# Patient Record
Sex: Male | Born: 1960 | Race: White | Hispanic: No | Marital: Married | Smoking: Current every day smoker
Health system: Southern US, Community
[De-identification: ages and names within clinical notes are randomized; demographics above are authoritative.]

## PROBLEM LIST (undated history)

## (undated) DIAGNOSIS — J45909 Unspecified asthma, uncomplicated: Secondary | ICD-10-CM

## (undated) DIAGNOSIS — J939 Pneumothorax, unspecified: Secondary | ICD-10-CM

## (undated) DIAGNOSIS — J449 Chronic obstructive pulmonary disease, unspecified: Secondary | ICD-10-CM

## (undated) DIAGNOSIS — M25562 Pain in left knee: Secondary | ICD-10-CM

## (undated) DIAGNOSIS — K7460 Unspecified cirrhosis of liver: Secondary | ICD-10-CM

## (undated) DIAGNOSIS — M25551 Pain in right hip: Secondary | ICD-10-CM

## (undated) HISTORY — PX: NO PAST SURGERIES: SHX2092

---

## 2003-11-29 ENCOUNTER — Other Ambulatory Visit: Payer: Self-pay

## 2004-05-25 ENCOUNTER — Other Ambulatory Visit: Payer: Self-pay

## 2004-06-15 ENCOUNTER — Other Ambulatory Visit: Payer: Self-pay

## 2004-08-26 ENCOUNTER — Other Ambulatory Visit: Payer: Self-pay

## 2004-09-13 ENCOUNTER — Other Ambulatory Visit: Payer: Self-pay

## 2004-12-18 ENCOUNTER — Emergency Department: Payer: Self-pay | Admitting: Emergency Medicine

## 2005-09-24 ENCOUNTER — Emergency Department: Payer: Self-pay | Admitting: Unknown Physician Specialty

## 2005-09-24 ENCOUNTER — Other Ambulatory Visit: Payer: Self-pay

## 2005-12-11 ENCOUNTER — Emergency Department: Payer: Self-pay | Admitting: Emergency Medicine

## 2006-01-18 ENCOUNTER — Other Ambulatory Visit: Payer: Self-pay

## 2006-01-18 ENCOUNTER — Inpatient Hospital Stay: Payer: Self-pay

## 2006-04-12 ENCOUNTER — Emergency Department: Payer: Self-pay | Admitting: Emergency Medicine

## 2006-04-26 ENCOUNTER — Ambulatory Visit: Payer: Self-pay | Admitting: Unknown Physician Specialty

## 2007-02-01 ENCOUNTER — Emergency Department: Payer: Self-pay | Admitting: Emergency Medicine

## 2007-04-11 ENCOUNTER — Emergency Department: Payer: Self-pay | Admitting: Emergency Medicine

## 2007-04-19 ENCOUNTER — Emergency Department: Payer: Self-pay | Admitting: Emergency Medicine

## 2007-04-19 ENCOUNTER — Other Ambulatory Visit: Payer: Self-pay

## 2007-04-29 ENCOUNTER — Emergency Department: Payer: Self-pay | Admitting: Emergency Medicine

## 2007-08-21 ENCOUNTER — Emergency Department: Payer: Self-pay | Admitting: Emergency Medicine

## 2007-08-25 ENCOUNTER — Inpatient Hospital Stay: Payer: Self-pay | Admitting: Internal Medicine

## 2007-08-25 ENCOUNTER — Other Ambulatory Visit: Payer: Self-pay

## 2007-09-01 ENCOUNTER — Emergency Department: Payer: Self-pay | Admitting: Emergency Medicine

## 2007-09-29 ENCOUNTER — Emergency Department: Payer: Self-pay | Admitting: Emergency Medicine

## 2007-10-16 ENCOUNTER — Ambulatory Visit: Payer: Self-pay | Admitting: Family Medicine

## 2007-10-28 ENCOUNTER — Other Ambulatory Visit: Payer: Self-pay

## 2007-10-28 ENCOUNTER — Inpatient Hospital Stay: Payer: Self-pay | Admitting: Internal Medicine

## 2007-12-19 ENCOUNTER — Inpatient Hospital Stay: Payer: Self-pay | Admitting: Internal Medicine

## 2007-12-19 ENCOUNTER — Other Ambulatory Visit: Payer: Self-pay

## 2008-01-09 ENCOUNTER — Ambulatory Visit: Payer: Self-pay | Admitting: Family Medicine

## 2008-01-25 ENCOUNTER — Other Ambulatory Visit: Payer: Self-pay

## 2008-01-26 ENCOUNTER — Inpatient Hospital Stay: Payer: Self-pay | Admitting: Internal Medicine

## 2008-01-27 ENCOUNTER — Ambulatory Visit: Payer: Self-pay | Admitting: Oncology

## 2008-02-07 ENCOUNTER — Ambulatory Visit: Payer: Self-pay | Admitting: Oncology

## 2008-02-15 ENCOUNTER — Emergency Department: Payer: Self-pay | Admitting: Emergency Medicine

## 2008-02-15 ENCOUNTER — Other Ambulatory Visit: Payer: Self-pay

## 2008-02-24 ENCOUNTER — Ambulatory Visit: Payer: Self-pay | Admitting: Oncology

## 2008-03-03 ENCOUNTER — Ambulatory Visit: Payer: Self-pay | Admitting: Internal Medicine

## 2008-03-07 ENCOUNTER — Ambulatory Visit: Payer: Self-pay | Admitting: Internal Medicine

## 2008-03-26 ENCOUNTER — Ambulatory Visit: Payer: Self-pay | Admitting: Oncology

## 2008-04-20 ENCOUNTER — Emergency Department: Payer: Self-pay | Admitting: Emergency Medicine

## 2008-10-20 ENCOUNTER — Emergency Department: Payer: Self-pay | Admitting: Emergency Medicine

## 2008-10-27 ENCOUNTER — Inpatient Hospital Stay: Payer: Self-pay | Admitting: Internal Medicine

## 2011-04-28 ENCOUNTER — Emergency Department: Payer: Self-pay | Admitting: Emergency Medicine

## 2011-06-26 ENCOUNTER — Ambulatory Visit: Payer: Self-pay | Admitting: Internal Medicine

## 2011-07-07 ENCOUNTER — Inpatient Hospital Stay: Payer: Self-pay | Admitting: Internal Medicine

## 2011-07-08 LAB — CEA: CEA: 13.2 ng/mL — ABNORMAL HIGH (ref 0.0–4.7)

## 2011-07-15 LAB — CANCER ANTIGEN 19-9: CA 19-9: <1 U/mL (ref 0–35)

## 2011-07-27 ENCOUNTER — Ambulatory Visit: Payer: Self-pay | Admitting: Internal Medicine

## 2011-09-21 ENCOUNTER — Ambulatory Visit: Payer: Self-pay | Admitting: Internal Medicine

## 2011-09-22 ENCOUNTER — Ambulatory Visit: Payer: Self-pay | Admitting: Internal Medicine

## 2011-09-26 ENCOUNTER — Ambulatory Visit: Payer: Self-pay | Admitting: Internal Medicine

## 2011-10-27 ENCOUNTER — Ambulatory Visit: Payer: Self-pay | Admitting: Internal Medicine

## 2011-12-14 ENCOUNTER — Ambulatory Visit: Payer: Self-pay | Admitting: Gastroenterology

## 2012-02-24 ENCOUNTER — Ambulatory Visit: Payer: Self-pay | Admitting: Gastroenterology

## 2012-04-08 ENCOUNTER — Emergency Department: Payer: Self-pay | Admitting: Emergency Medicine

## 2012-11-14 ENCOUNTER — Ambulatory Visit: Payer: Self-pay | Admitting: Gastroenterology

## 2013-02-02 LAB — CBC
HCT: 42.4 % (ref 40.0–52.0)
HGB: 14 g/dL (ref 13.0–18.0)
MCH: 36.1 pg — ABNORMAL HIGH (ref 26.0–34.0)
MCHC: 33.1 g/dL (ref 32.0–36.0)
MCV: 109 fL — ABNORMAL HIGH (ref 80–100)
Platelet: 155 10*3/uL (ref 150–440)
RBC: 3.88 10*6/uL — ABNORMAL LOW (ref 4.40–5.90)

## 2013-02-02 LAB — COMPREHENSIVE METABOLIC PANEL
Albumin: 3.9 g/dL (ref 3.4–5.0)
Alkaline Phosphatase: 117 U/L (ref 50–136)
BUN: 8 mg/dL (ref 7–18)
Co2: 26 mmol/L (ref 21–32)
Creatinine: 1.19 mg/dL (ref 0.60–1.30)
EGFR (African American): >60
EGFR (Non-African Amer.): >60
Glucose: 92 mg/dL (ref 65–99)
Osmolality: 272 (ref 275–301)
Sodium: 137 mmol/L (ref 136–145)

## 2013-02-02 LAB — LIPASE, BLOOD: Lipase: 82 U/L (ref 73–393)

## 2013-02-02 LAB — RAPID INFLUENZA A&B ANTIGENS

## 2013-02-03 ENCOUNTER — Inpatient Hospital Stay: Payer: Self-pay | Admitting: Internal Medicine

## 2013-02-03 LAB — URINALYSIS, COMPLETE
Bilirubin,UR: NEGATIVE
Blood: NEGATIVE
Glucose,UR: NEGATIVE mg/dL (ref 0–75)
Hyaline Cast: 21
Leukocyte Esterase: NEGATIVE
Nitrite: NEGATIVE
Ph: 5 (ref 4.5–8.0)
RBC,UR: <1 /HPF (ref 0–5)
Specific Gravity: 1.01 (ref 1.003–1.030)
Squamous Epithelial: <1

## 2013-02-03 LAB — HEPATIC FUNCTION PANEL A (ARMC)
Alkaline Phosphatase: 84 U/L (ref 50–136)
Bilirubin,Total: 0.8 mg/dL (ref 0.2–1.0)
SGOT(AST): 22 U/L (ref 15–37)
SGPT (ALT): 14 U/L (ref 12–78)

## 2013-02-03 LAB — LIPASE, BLOOD: Lipase: 53 U/L — ABNORMAL LOW (ref 73–393)

## 2013-02-05 LAB — CBC WITH DIFFERENTIAL/PLATELET
Eosinophil %: 0.8 %
HCT: 33.2 % — ABNORMAL LOW (ref 40.0–52.0)
HGB: 11.3 g/dL — ABNORMAL LOW (ref 13.0–18.0)
Lymphocyte %: 17.7 %
MCH: 37.2 pg — ABNORMAL HIGH (ref 26.0–34.0)
MCHC: 34.2 g/dL (ref 32.0–36.0)
Monocyte #: 1 x10 3/mm (ref 0.2–1.0)
Neutrophil #: 7.6 10*3/uL — ABNORMAL HIGH (ref 1.4–6.5)
Neutrophil %: 71.9 %
Platelet: 108 10*3/uL — ABNORMAL LOW (ref 150–440)
WBC: 10.6 10*3/uL (ref 3.8–10.6)

## 2013-02-05 LAB — BASIC METABOLIC PANEL
Anion Gap: 8 (ref 7–16)
BUN: 6 mg/dL — ABNORMAL LOW (ref 7–18)
Creatinine: 0.78 mg/dL (ref 0.60–1.30)
EGFR (African American): >60
Glucose: 122 mg/dL — ABNORMAL HIGH (ref 65–99)
Osmolality: 276 (ref 275–301)
Potassium: 4 mmol/L (ref 3.5–5.1)
Sodium: 139 mmol/L (ref 136–145)

## 2013-02-06 LAB — PLATELET COUNT: Platelet: 132 10*3/uL — ABNORMAL LOW (ref 150–440)

## 2013-02-08 LAB — CBC WITH DIFFERENTIAL/PLATELET
Basophil %: 0.4 %
HCT: 32.8 % — ABNORMAL LOW (ref 40.0–52.0)
Lymphocyte #: 0.9 10*3/uL — ABNORMAL LOW (ref 1.0–3.6)
Lymphocyte %: 10.8 %
MCH: 36.7 pg — ABNORMAL HIGH (ref 26.0–34.0)
MCHC: 34.2 g/dL (ref 32.0–36.0)
MCV: 107 fL — ABNORMAL HIGH (ref 80–100)
Monocyte #: 0.8 x10 3/mm (ref 0.2–1.0)
Monocyte %: 9.8 %
Neutrophil #: 6.4 10*3/uL (ref 1.4–6.5)
Neutrophil %: 78.9 %
Platelet: 165 10*3/uL (ref 150–440)
WBC: 8.1 10*3/uL (ref 3.8–10.6)

## 2013-02-08 LAB — EXPECTORATED SPUTUM ASSESSMENT W GRAM STAIN, RFLX TO RESP C

## 2013-02-08 LAB — BASIC METABOLIC PANEL
Anion Gap: 6 — ABNORMAL LOW (ref 7–16)
BUN: 19 mg/dL — ABNORMAL HIGH (ref 7–18)
EGFR (African American): >60
EGFR (Non-African Amer.): >60
Glucose: 133 mg/dL — ABNORMAL HIGH (ref 65–99)
Potassium: 3.9 mmol/L (ref 3.5–5.1)

## 2013-02-08 LAB — CULTURE, BLOOD (SINGLE)

## 2013-02-09 LAB — CULTURE, BLOOD (SINGLE)

## 2013-02-11 LAB — BASIC METABOLIC PANEL
Calcium, Total: 8.2 mg/dL — ABNORMAL LOW (ref 8.5–10.1)
Chloride: 99 mmol/L (ref 98–107)
Co2: 32 mmol/L (ref 21–32)
EGFR (Non-African Amer.): >60
Glucose: 137 mg/dL — ABNORMAL HIGH (ref 65–99)
Potassium: 4.4 mmol/L (ref 3.5–5.1)
Sodium: 138 mmol/L (ref 136–145)

## 2013-02-11 LAB — VANCOMYCIN, TROUGH: Vancomycin, Trough: 15 ug/mL (ref 10–20)

## 2013-02-11 LAB — CBC WITH DIFFERENTIAL/PLATELET
Eosinophil #: 0 10*3/uL (ref 0.0–0.7)
Eosinophil %: 0 %
HGB: 12.3 g/dL — ABNORMAL LOW (ref 13.0–18.0)
Lymphocyte #: 0.6 10*3/uL — ABNORMAL LOW (ref 1.0–3.6)
MCH: 36.4 pg — ABNORMAL HIGH (ref 26.0–34.0)
MCHC: 33.9 g/dL (ref 32.0–36.0)
MCV: 108 fL — ABNORMAL HIGH (ref 80–100)
Monocyte #: 0.4 x10 3/mm (ref 0.2–1.0)
Monocyte %: 4.7 %
RBC: 3.37 10*6/uL — ABNORMAL LOW (ref 4.40–5.90)

## 2013-02-13 LAB — BASIC METABOLIC PANEL
Anion Gap: 7 (ref 7–16)
Calcium, Total: 8.4 mg/dL — ABNORMAL LOW (ref 8.5–10.1)
Chloride: 98 mmol/L (ref 98–107)
Co2: 32 mmol/L (ref 21–32)
EGFR (African American): >60
Potassium: 4.3 mmol/L (ref 3.5–5.1)

## 2013-04-08 ENCOUNTER — Ambulatory Visit: Payer: Self-pay

## 2013-04-08 LAB — COMPREHENSIVE METABOLIC PANEL
Albumin: 3.4 g/dL (ref 3.4–5.0)
Alkaline Phosphatase: 105 U/L (ref 50–136)
Anion Gap: 2 — ABNORMAL LOW (ref 7–16)
BUN: 3 mg/dL — ABNORMAL LOW (ref 7–18)
Chloride: 108 mmol/L — ABNORMAL HIGH (ref 98–107)
Co2: 31 mmol/L (ref 21–32)
Creatinine: 0.65 mg/dL (ref 0.60–1.30)
EGFR (African American): >60
Glucose: 97 mg/dL (ref 65–99)
Potassium: 3.5 mmol/L (ref 3.5–5.1)
SGOT(AST): 31 U/L (ref 15–37)
SGPT (ALT): 19 U/L (ref 12–78)
Sodium: 141 mmol/L (ref 136–145)
Total Protein: 7 g/dL (ref 6.4–8.2)

## 2013-04-26 ENCOUNTER — Ambulatory Visit: Payer: Self-pay | Admitting: Family Medicine

## 2013-05-06 ENCOUNTER — Ambulatory Visit: Payer: Self-pay | Admitting: Gastroenterology

## 2014-02-22 LAB — BASIC METABOLIC PANEL
Anion Gap: 4 — ABNORMAL LOW (ref 7–16)
BUN: 8 mg/dL (ref 7–18)
CHLORIDE: 103 mmol/L (ref 98–107)
Calcium, Total: 8.3 mg/dL — ABNORMAL LOW (ref 8.5–10.1)
Co2: 26 mmol/L (ref 21–32)
Creatinine: 0.86 mg/dL (ref 0.60–1.30)
Glucose: 87 mg/dL (ref 65–99)
Osmolality: 264 (ref 275–301)
POTASSIUM: 4.2 mmol/L (ref 3.5–5.1)
Sodium: 133 mmol/L — ABNORMAL LOW (ref 136–145)

## 2014-02-22 LAB — CBC
HCT: 42.4 % (ref 40.0–52.0)
HGB: 14.4 g/dL (ref 13.0–18.0)
MCH: 35.4 pg — AB (ref 26.0–34.0)
MCHC: 33.9 g/dL (ref 32.0–36.0)
MCV: 105 fL — ABNORMAL HIGH (ref 80–100)
Platelet: 139 10*3/uL — ABNORMAL LOW (ref 150–440)
RBC: 4.06 10*6/uL — ABNORMAL LOW (ref 4.40–5.90)
RDW: 13.6 % (ref 11.5–14.5)
WBC: 9.7 10*3/uL (ref 3.8–10.6)

## 2014-02-22 LAB — TROPONIN I: Troponin-I: <0.02 ng/mL

## 2014-02-23 ENCOUNTER — Inpatient Hospital Stay: Payer: Self-pay | Admitting: Internal Medicine

## 2014-02-23 LAB — RAPID INFLUENZA A&B ANTIGENS

## 2014-02-26 LAB — PLATELET COUNT: PLATELETS: 123 10*3/uL — AB (ref 150–440)

## 2014-02-28 LAB — CULTURE, BLOOD (SINGLE)

## 2015-02-12 ENCOUNTER — Emergency Department: Payer: Self-pay | Admitting: Emergency Medicine

## 2015-04-17 NOTE — Discharge Summary (Signed)
PATIENT NAME:  Albert Fowler, Albert Fowler MR#:  [id number] DATE OF BIRTH:  1961/07/15  DATE OF ADMISSION:  02/03/2013 DATE OF DISCHARGE:  02/14/2013  ADMITTING DIAGNOSES: Fevers, chills, cough, generalized weakness.   DISCHARGE DIAGNOSES:  1. Systemic inflammatory response reaction secondary to methicillin-resistant Staphylococcus aureus pneumonia.  2. Ileus versus pseudo-obstruction secondary to constipation, now improved.  3. Septic shock due to pneumonia.  4. Acute on chronic obstructive pulmonary disease exacerbation leading to acute respiratory failure.  5. Urinary retention likely related to immobility and hospitalization, now resolved.  6. Abdominal pain felt to be cough, urinary retention and possibly constipation, now symptoms resolved.  7. Ongoing tobacco abuse.  8. Generalized weakness.  9. History of liver cirrhosis.  10. Alcohol abuse.  11. History of pneumothorax in the past.   PERTINENT LABORATORY AND EVALUATIONS: PA and lateral chest x-ray on February 8 revealed no acute cardiopulmonary process. Repeat 3-view of the abdomen PA and lateral revealed nonobstructive gas pattern, new heterogenous opacity in the right lung concerning for an infection. Chest x-ray on February 13 revealed bilateral upper lobe pneumonia. CT scan of the abdomen and pelvis without contrast showed mildly dilated loops of small bowel, but no obstruction. Admitting BMP: Glucose was 92, BUN 8, creatinine 1.19, sodium 137, potassium 4.1, chloride was 103, CO2 was 26, calcium was 9.1, lipase 82. LFTs were normal. WBC count was 14.8, hemoglobin 14.0, platelet count was 109. Influenza A and B were negative. Blood cultures showed coag-negative Staph. Sputum culture showed heavy methicillin-resistant Staph aureus. Most recent BMP on February 17 showed a BUN of 21, creatinine 0.80, and WBC count was 9.4, hemoglobin 12.3, platelet count was 177. Chest x-ray on the 17th showed mild interstitial edema.   CONSULTANTS: Christopher A.  Lundquist, MD  HOSPITAL COURSE: Please refer to interim summary done by Dr. Katharina Caperima Vaickute done on date of February 14. The patient is a 54 year old white male with history of emphysema, ongoing tobacco abuse, who presented on February 8 with complaint of fevers, chills, cough as well as generalized weakness. The patient, on arrival, was thought to have possible COPD exacerbation as well as possible pneumonia. He was treated with IV Levaquin. The patient had repeat chest x-ray which showed pneumonia again. His sputum cultures did show MRSA. He was started on vancomycin since the 15th. The patient was slow to recover. He was also treated for COPD exacerbation, and he received some IV Lasix for fluid overload. The patient's respiratory status is significantly improved, and now he is on room air. For his MRSA pneumonia, the patient received vancomycin from the 15th, so he is going to be finishing a course of Zyvox for the next 10 days. Zyvox assistance program was called and made sure that he would have a co-pay of $6. The patient also was noted to have COPD exacerbation, which was treated with nebulizers and steroids. He also developed abdominal distention and pain during hospitalization for which he was seen by surgery. He was thought to have an ileus versus pseudo-obstruction. He was treated with laxatives, with significant improvement in his symptoms. The patient also had some urinary retention, likely as a result of the hospitalization. He has been able to urinate without any difficulties. At this point, he is afebrile, his respiratory status is significantly improved, and he is feeling much better and is stable for discharge to home.   DISCHARGE MEDICATIONS:  1. Advair 250/50 one puff b.i.d.  2. Nadolol 20 mg 1 tab p.o. daily.  3. Protonix 40  daily.  4. Zoloft 50 daily.  5. Spiriva 18 mcg daily.  6. Hydroxyzine 25 mg 4 times per day as needed for itching.  7. Tylenol 650 q.6 p.r.n. for headache.  8.  Albuterol/Atrovent nebulizers 1 puff 4 times per day as needed.  9. Senna 1 tab p.o. daily.  10. Zyvox 600 mg 1 tab p.o. q.12 for 10 days.   DIET: Low sodium, low fat, low cholesterol.   ACTIVITY: As tolerated.   DISCHARGE FOLLOWUP: Follow with the Jeralyn Ruths in 1 to 2 weeks.   TIME SPENT: 35 minutes spent on the discharge.    ____________________________ Lacie Scotts. Allena Katz, MD shp:OSi D: 02/14/2013 17:50:16 ET T: 02/15/2013 05:50:17 ET JOB#: [id number]  cc: Francys Bolin Fowler. Allena Katz, MD, <Dictator> Charise Carwin MD ELECTRONICALLY SIGNED 02/16/2013 12:14

## 2015-04-17 NOTE — H&P (Signed)
PATIENT NAME:  Albert Fowler, HASEGAWA MR#:  [id number] DATE OF BIRTH:  1961-11-10  DATE OF ADMISSION:  02/03/2013  PRIMARY CARE PHYSICIAN:  [facility].   REFERRING PHYSICIAN:  Dr. Si Raider.   CHIEF COMPLAINT:  Fever, chills, cough and generalized weakness.   HISTORY OF PRESENT ILLNESS:  The patient is a 54 year old Caucasian male with history of chronic obstructive pulmonary disease, ongoing tobacco abuse, liver cirrhosis.  He was in his usual state of health until about yesterday when he had generalized weakness associated with cough for the last two days, however today he started to have fever and chills.  He had generalized aching muscles pains and worsening of his generalized weakness.  The patient was evaluated in the Emergency Room and he was found to have fever reaching 100.1 axillary and evidence of hypotension with a blood pressure systolic between 70 to 80.  The patient was given intravenous fluids.  Blood cultures were taken and prompt IV antibiotic was started.  His chest x-ray had failed to show pneumonia, although he has fine crackles at the right lower base.   REVIEW OF SYSTEMS:  CONSTITUTIONAL:  Reports fever and chills and generalized fatigue.  EYES:  No blurring of vision.  No double vision.  EARS, NOSE, THROAT:  No hearing impairment.  No sore throat.  No dysphagia.  CARDIOVASCULAR:  No chest pain.  No shortness of breath.  No palpitations.  No syncope. RESPIRATORY:  He has cough, a few wheezing.  No hemoptysis.  No chest pain.  GASTROINTESTINAL:  No abdominal pain, no vomiting, no diarrhea.  GENITOURINARY:  No dysuria.  No frequency of urination.  MUSCULOSKELETAL:  He has generalized aching pains, but no joint swelling.  No muscular pain or swelling other than his aching pains.  INTEGUMENTARY:  No skin rash.  No ulcers.  NEUROLOGY:  No focal weakness.  No seizure activity.  No headache.  PSYCHIATRY:  No anxiety.  No depression.  ENDOCRINE:  No polyuria or  polydipsia.  No heat or cold intolerance.  HEMATOLOGY:  No easy bruisability.  No lymph node enlargement.   PAST MEDICAL HISTORY:  Liver cirrhosis, ex-chronic alcoholic.  He quit two years ago.  History of ongoing tobacco abuse, emphysema, history of left-sided pneumothorax.   PAST SURGICAL HISTORY:  Chest tube to left lung due to development of pneumothorax.   SOCIAL HABITS:  Chronic smoker 2 packs per day since the age of 59 and he continues to smoke.  He is ex-chronic alcoholic.  He quit two years ago.  No other drug abuse.   FAMILY HISTORY:  His older brother died from complication of liver cirrhosis.  His father died from lymph node cancer, likely he is referring to lymphoma.  His mother died from complications of emphysema and she was a smoker.   SOCIAL HISTORY:  He is married, living with his wife.  He lives on disability based on his lung problem.   ADMISSION MEDICATIONS:  Advair Diskus 250/50 twice a day, Spiriva 1 inhalation once a day, calcium with vitamin D twice a day, magnesium oxide 400 mg 3 times a day, ibuprofen as needed, Protonix 40 mg a day, Zoloft 25 mg once a day.   ALLERGIES:  CEPHALOZIN CAUSING SKIN RASH.   PHYSICAL EXAMINATION: VITAL SIGNS:  Blood pressure 75 to 80 systolic, at one point went up to 100 after intravenous bolus of fluid, however it is back down again.  Heart rate is 102 per minute.  Respiratory rate is 20,  temperature 100.1 axillary.  Latest blood pressure was 74/48.  GENERAL APPEARANCE:  Middle-aged male who looks much older than his stated age sitting on the stretcher in no acute distress.  HEAD AND NECK:  No pallor.  No icterus.  No cyanosis.  ENT:  Ear examination revealed normal hearing, no discharge, no lesions.  Nasal mucosa was normal, no ulcers, no bleeding or discharge.  Oropharyngeal area showed patient has no teeth except one tooth.  No oral thrush.  No ulcers.  EYE:  Revealed normal eyelids and conjunctivae.  The pupils were 6 mm, round,  equal and reactive to light.  NECK:  Supple.  Trachea at midline.  No thyromegaly.  No cervical lymphadenopathy.  No masses.  HEART:  Distant heart sounds, barely audible S1 and S2.  No S3 or S4.  No murmur.  No gallop.  No carotid bruits.  RESPIRATORY:  Normal breathing pattern at the upper normal of 20 per minute without use of accessory muscles.  He has fine crackles at the right base.  Few scattered wheezing.  Good air entry.  ABDOMEN:  Soft without tenderness.  No hepatosplenomegaly.  No masses.  No hernias.  SKIN:  No ulcers.  No subcutaneous nodules.  He has hypopigmented lesions over the shoulders and the back.  MUSCULOSKELETAL:  No joint swelling.  No clubbing.  NEUROLOGIC:  Cranial nerves II-XII were intact.  No focal motor deficit.  PSYCHIATRIC:  The patient is alert, oriented x 3.  Mood and affect were normal.   LABORATORY FINDINGS AND RADIOLOGIC DATA:  His chest x-ray had failed to show pneumonia as read by the official report, however maybe there is subtle prominence of the lung markings at the right base, may indicate early pneumonia.  This is  a little more prominent than his chest x-ray that was done on July of 2012.  EKG showed sinus tachycardia at rate of 108 per minute, otherwise unremarkable EKG.  CBC showed a white count of 14,800, hemoglobin 14, hematocrit 42, platelet count 155.  Influenza A and B were negative.  Normal liver function tests and liver transaminases.  Serum glucose 92, BUN 8, creatinine 1.1, sodium 137, potassium 4.1, lipase 82.  Calcium 9.1.   ASSESSMENT: 1.  Septic shock.  2.  Acute bronchitis, although I suspect early right lower lobe pneumonia, however the chest x-ray is not supporting that.  3.  Liver cirrhosis.  4.  Chronic obstructive pulmonary disease.  5.  Tobacco abuse.   PLAN:  The patient is now receiving intravenous fluid loading doses to improve his blood pressure.  Blood cultures x 2 were taken.  IV antibiotic using Levaquin was already  started.  Urine was not checked, therefore I just ordered now urine for urinalysis.  Oxygen supplementation and continue home medications as listed above, however I will hold nadolol due to his hypotension.  The patient needs to quit tobacco and I offered nicotine patch.  We will monitor his response to the treatment.  For deep vein thrombosis prophylaxis, I started him on subcutaneous heparin 5000 units twice a day.   TIME SPENT IN EVALUATING THIS PATIENT:  Took more than 55 minutes including reviewing his medical records.      ____________________________ Carney CornersAmir M. Rudene Rearwish, MD amd:ea D: 02/03/2013 00:39:00 ET T: 02/03/2013 04:39:10 ET JOB#: [id number]  cc: Carney CornersAmir M. Rudene Rearwish, MD, <Dictator> Zollie ScaleAMIR M Genell Thede MD ELECTRONICALLY SIGNED 02/04/2013 22:45

## 2015-04-17 NOTE — Consult Note (Signed)
PATIENT NAME:  Albert GivensGARNER, Majed H MR#:  [id number] DATE OF BIRTH:  1961/04/04  DATE OF CONSULTATION:  02/07/2013  REFERRING PHYSICIAN:   CONSULTING PHYSICIAN:  Cristal Deerhristopher A. Shizuye Rupert, MD  REASON FOR CONSULTATION:  Evaluate pericholecystic fluid on CT scan as well as dilated bowel.   HISTORY OF PRESENT ILLNESS:  The patient presented on February 8th with weakness, fever, generalized malaise. He was found to be hypotensive upon admission and was thought to be in septic shock and likely due to pneumonia in the context of cirrhosis and COPD. While in the hospital, he was started on pressors and also was given IV antibiotics. He was to be discharged yesterday; however, he has become increasingly distended during his admission. He also says that he has had left lower quadrant pain which is not new, but has been going on for the last 2 months. Otherwise, no current fevers, chills, night sweats, shortness of breath, cough, chest pain, nausea, vomiting. He does say that he feels constipated at baseline, but was able to have bowel movements today with enemas and laxatives. No dysuria or hematuria.   PAST MEDICAL HISTORY:   1.  History of cirrhosis due to alcohol.  2.  History of esophageal varices seen on EGD.  3.  History of GI bleed due to duodenal ulcer.  4.  History of COPD.  5.  History of left-sided spontaneous pneumothorax status post thoracoscopy, apical blebectomy and pleurodesis.   SOCIAL HABITS:  Does not smoke 2 packs a day which he continues, has not drank for approximately 2 years. No other drug abuse.   FAMILY HISTORY:  Older brother died from cirrhosis, father died from likely lymphoma. Mother died from complications of emphysema.   SOCIAL HISTORY:  Married, lives with wife, is on disability due to COPD.   ADMISSION MEDICATIONS:   1.  Advair Diskus.  2.  Spiriva.  3.  Calcium with vitamin D.  4.  Magnesium oxide.  5.  Ibuprofen p.r.n.  6.  Protonix.  7.  Zoloft.   ALLERGIES:   ANCEF CAUSES SKIN RASH.   REVIEW OF SYSTEMS:  A 12-point review of systems as above.   PHYSICAL EXAMINATION: VITAL SIGNS: Temperature 97.9, pulse 100, blood pressure 133/81, respirations 20, 91% on 2 liters.  GENERAL: No acute distress, alert and oriented x 3, appears chronically ill.  HEAD: Normocephalic, atraumatic.  EYES: No scleral icterus. No conjunctivitis.  FACE: No obvious facial trauma, normal external nose, normal external ears.  LUNGS: Clear to auscultation, moving air well.  HEART: Regular rate and rhythm. No murmurs, rubs or gallops.  ABDOMEN: Soft, moderately distended, nontender in right upper quadrant and left lower quadrant.  EXTREMITIES: Moves all extremities well. Strength is 5 out of 5 in all 4 extremities.  NEUROLOGIC: Cranial nerves II through XII grossly intact. Sensation intact in all 4 extremities.   DIAGNOSTIC DATA:  White cell count of 10.6 down from 14.8 at admission on February 11th, hemoglobin and hematocrit are normal and platelets are 132. LFTs are normal. Lipase is normal. BMP is normal.   CT shows:  1.  I have personally reviewed his CT scan that shows scattered ground-glass pulmonary opacities in bilateral lungs, may be infectious.  2.  Abdomen. Mildly dilated loops of small bowel with no significantly collapsed small bowel, likely ileus pattern per my interpretation.  3.  Cholelithiasis. A small amount of fluid surrounding the gallbladder as well as free fluid in the pelvis. No gallbladder wall thickening.  4.  Significant  stool in the transverse colon and descending colon without any thickening. Does have possibly mild thickening of the rectum which was not read by radiologist.   ASSESSMENT AND PLAN:  The patient is a pleasant 54 year old male who presented with shortness of breath, fever, malaise and was treated for pneumonia. I believe his bowel distention is likely due to ileus. He has a small amount of pelvic fluid and pericholecystic fluid; however, I  am unsure if this is reactive due to his primary process for which he was admitted versus he may have small ascites due to his cirrhosis as per previous consultation notes. It is deemed to be pretty significant as gallstones; however, he does not have symptoms consistent with biliary disease, no right upper quadrant pain currently or at all with any ingestions. Would recommend laxatives versus enemas to allow the patient to have regular bowel movements. No acute surgical intervention necessary or indications for surgery.    ____________________________ Si Raider Candice Tobey, MD cal:si D: 02/07/2013 19:53:30 ET T: 02/07/2013 20:21:32 ET JOB#: [id number]  cc: Cristal Deer A. Mishawn Didion, MD, <Dictator> Jarvis Newcomer MD ELECTRONICALLY SIGNED 02/13/2013 14:05

## 2015-04-18 NOTE — H&P (Signed)
PATIENT NAME:  Albert Fowler, [name] MR#:  [id number] DATE OF BIRTH:  Apr 02, 1961  DATE OF ADMISSION:  02/23/2014  REFERRING PHYSICIAN:  Dr. Dolores FrameSung.   PRIMARY CARE PHYSICIAN:  Dr. Butler DenmarkWroth.   CHIEF COMPLAINT:  Shortness of breath.   HISTORY OF PRESENT ILLNESS:  A 54 year old Caucasian gentleman with past medical history of COPD, non-O2 dependent, alcoholic cirrhosis, presenting with shortness of breath.  Describes two day duration of shortness of breath, mainly as chest tightness and dyspnea on exertion with associated cough, productive yellow sputum with associated fevers and chills.  Denies recent sick contacts.  Denies chest pain, palpitations, or edema.  Denies orthopnea or PND.  Upon arrival to the Emergency Department, he was noted to be hypoxemic, requiring supplemental O2 to keep SaO2 greater than 92%.   REVIEW OF SYSTEMS:  CONSTITUTIONAL:  Positive for fevers, chills, fatigue.  EYES:  Denies blurred vision, double vision, eye pain. EARS, NOSE, THROAT:  Denies tinnitus, ear pain, hearing loss.  RESPIRATORY:  Positive for cough, wheeze, shortness of breath.  Denies hemoptysis.  CARDIOVASCULAR:  Denies chest pain, palpitations and edema.  GASTROINTESTINAL:  Denies nausea, vomiting, diarrhea, abdominal pain.  GENITOURINARY:  Denies dysuria or hematuria.  ENDOCRINE:  Denies nocturia or thyroid problems.  HEMATOLOGIC AND LYMPHATIC:  Denies easy bruising or bleeding.  SKIN:  Denies rash or lesions.  MUSCULOSKELETAL:  Denies pain in neck, back, shoulder, knees, hips or arthritic symptoms.  NEUROLOGIC:  Denies paralysis, paresthesias.  PSYCHIATRIC:  Denies any anxiety or depressive symptoms.  Otherwise, full review of systems performed by me is negative.   PAST MEDICAL HISTORY:  COPD, alcoholic cirrhosis, history of anxiety, depression, not otherwise specified.  Thrombocytopenia.  Pneumothorax status post a left lung surgery.   SOCIAL HISTORY:  Current every day tobacco use.  Denies any recent  alcohol use, stating last drink greater than four years.  Denies drug use.   FAMILY HISTORY:  Positive for coronary artery disease as well as cancer though of unknown type.   ALLERGIES:  CEFAZOLIN.   HOME MEDICATIONS:  Include Tylenol 500 mg 2 tabs every six hours needed for headache.  Zoloft 25 mg by mouth daily, hydroxyzine 25 mg 4 times daily as needed for itching, nadolol 20 mg by mouth daily, Advair 250/50 mcg inhalation 1 puff twice daily, Spiriva 18 mcg inhalation daily, lactulose 10 grams daily, senna 1 tab by mouth daily, Montelukast 10 mg by mouth daily, Protonix 40 mg by mouth daily, dextromethorphan guaifenesin 10 mg/500 mg per 5 mL, 10 mL every four hours as needed for cough, calcium plus vitamin D 500/200 international unit 2 times daily.   PHYSICAL EXAMINATION: VITAL SIGNS:  Temperature 101.7, heart rate 100, respirations 36, blood pressure 111/65, saturating 95% on supplemental O2 2 liters nasal cannula.  Weight 70.3 kg, BMI 24.3.  GENERAL:  Well-nourished, well-developed gentleman who appears acutely ill and in moderate respiratory distress, having difficulty speaking in full sentences.  HEAD:  Normocephalic, atraumatic.  EYES:  Pupils equal, round, reactive to light.  Extraocular muscles intact.  No scleral icterus.  MOUTH:  Moist mucosal membranes.  Dentition intact.  No abscess noted.  EAR, NOSE, THROAT:  Throat clear without exudates.  No external lesions.  NECK:  Supple.  No thyromegaly.  No nodules.  No JVD.  PULMONARY:  Prolonged expiratory phase with associated wheezing, most prominent in the left upper and right lower lobe as well as bibasilar coarse breath sounds with scattered rhonchi.  No use of accessory muscles, though  tachypneic at this time.  Good respiratory effort.  CHEST:  Nontender to palpation.  CARDIOVASCULAR:  S1, S2, tachycardic.  No murmurs, rubs or gallops.  No edema.  Pedal pulses 2+ bilaterally. GASTROINTESTINAL:  Soft, nontender, nondistended.  No  masses.  Positive bowel sounds.  No hepatosplenomegaly.  MUSCULOSKELETAL:  No swelling, clubbing, edema.  Range of motion full in all extremities.  NEUROLOGIC:  Cranial nerves II through XII intact.  No gross focal neurological deficits.  Sensation intact.  Reflexes intact.  SKIN:  No ulcerations, lesions, rashes, cyanosis.  Skin warm, dry.  Turgor intact.  PSYCHIATRIC:  Mood and affect within normal limits.  The patient is awake, alert and oriented x 3.  Insight and judgment intact.   LABORATORY DATA:  Sodium 133, potassium 4.2, chloride 103, bicarb 26, BUN 8, creatinine 0.86, glucose 87.  WBC 9.7, hemoglobin 14.4, platelets 139.  Chest x-ray performed revealing increased interstitial markings; however, no acute cardiopulmonary disease at this time.   ASSESSMENT AND PLAN:  A 54 year old Caucasian gentleman with past medical history of chronic obstructive pulmonary disease, cirrhosis, presenting with shortness of breath.  1.  Sepsis, meeting septic criteria by tachycardia, tachypnea and fever, likely pulmonary source, place on Levaquin for antibiotic coverage, follow culture data of blood and sputum, IV fluid hydration to keep mean arterial pressure greater than 65.  2.  Acute hypoxemic respiratory failure secondary to chronic obstructive pulmonary disease exacerbation, indicated by inability to speak in full sentences secondary to difficulty breathing as well as hypoxemia on arrival, provide DuoNeb q. 4 hours, supplemental O2 to keep SaO2 greater than 92%, incentive spirometry.  Solu-Medrol 60 mg IV daily.  3.  Thrombocytopenia, which is an old finding, no indication for transfusion at this time.  No active bleeding.  Transfusion threshold will be platelets less than 20,000 if no bleeding or less than 50,000 if active bleeding.  4.  Cirrhosis.  Continue with nadolol and lactulose.  5.  Venous thromboembolic prophylaxis with heparin subQ.  6.  CODE STATUS:  THE PATIENT IS FULL CODE.   TIME SPENT:  45  minutes.    ____________________________ Cletis Athens. Hower, MD dkh:ea D: 02/23/2014 00:36:45 ET T: 02/23/2014 02:23:20 ET JOB#: [id number]  cc: Cletis Athens. Hower, MD, <Dictator> DAVID Synetta Shadow MD ELECTRONICALLY SIGNED 02/23/2014 20:55

## 2015-04-18 NOTE — Discharge Summary (Signed)
PATIENT NAME:  Albert Fowler, Albert H MR#:  [id number] DATE OF BIRTH:  1961/02/19  DATE OF ADMISSION:  02/23/2014 DATE OF DISCHARGE:  02/26/2014  DISCHARGE DIAGNOSES: 1.  Chronic obstructive pulmonary disease exacerbation. 2.  Sepsis due to bronchitis.  3.  Cirrhosis.  4.  Tobacco abuse. 5.  Thrombocytopenia.  DISCHARGE MEDICATIONS:   1.  Advair Diskus 250/50, 1 puff b.i.d.  2.  Nadolol 20 mg p.o. daily.  3.  Protonix 40 mg p.o. daily.  4.  Zoloft 25 mg p.o. daily. 5.  Spiriva 18 mcg inhalation daily.  6.  Hydroxyzine hydrochloride 25 mg p.o. 4 times daily. 7.  Albuterol with ipratropium 100/20 mcg 1 puff 4 times daily.  8.  Calcium with vitamin D.  9.  Lactulose 10 grams (that is 15 mL) once a day.  10.  Singulair 10 mg p.o. daily.  11.  Levaquin 500 mg daily. The patient received Levaquin for 7 days.  12.  Prednisone 20 mg 3 tablets daily for 2 days, 2 tablets daily for 2 days, 1 tablet daily for 2 days, and then stop.   The patient was advised to stop the following medication: Robitussin.   Discharged home without any home health but oxygen.   HOSPITAL COURSE:  This is a 54 year old male patient with history of COPD, alcohol cirrhosis, continued tobacco abuse up to 2 packs a day, comes in because of shortness of breath, cough, and also a little phlegm. Also had a fever. The patient's O2 sats were 92% on room air, requiring oxygen. The patient's fever was 101.7 in the Emergency Room. He was also tachycardic with heart rate of 100 beats per minute. He was admitted for sepsis by meeting sepsis criteria with tachycardia, tachypnea, and fever. The patient was started on Levaquin along with Solu-Medrol, IV fluids, and oxygen. The patient had acute hypoxic respiratory failure evidenced by inability to speak full sentences due to difficulty breathing with hypoxemia on arrival. The patient continued on oxygen and Solu-Medrol. Symptoms nicely improved. The patient's labs were normal on admission. Chest  x-ray showed no evidence of acute cardiopulmonary disease on admission. The patient's blood cultures were negative. He felt much better with the Levaquin and steroids. Discharged home with prednisone and Levaquin to complete the course. He is advised to quit smoking. He has Spiriva and Advair. We checked the O2 sats on exertion, on room air and rest. They were above 89%, dropped to 88 just briefly, then went up quickly to above 88%, so he did not qualify for home  oxygen. At the time of discharge, O2 sats were 91% on room air at, and the patient went home in stable condition. The patient also has a history of cirrhosis and thrombocytopenia. The patient follows up with Dr. Ezekiel Slocumbom Wroth.  TIME SPENT ON DISCHARGE PREPARATION: More than 30 minutes.    ____________________________ Katha HammingSnehalatha Jahmiyah Dullea, MD sk:jcm D: 02/28/2014 12:33:17 ET T: 02/28/2014 14:08:14 ET JOB#: [id number]  cc: Katha HammingSnehalatha Sherrie Marsan, MD, <Dictator> Katha HammingSNEHALATHA An Schnabel MD ELECTRONICALLY SIGNED 03/09/2014 11:45

## 2015-05-09 ENCOUNTER — Inpatient Hospital Stay
Admission: EM | Admit: 2015-05-09 | Discharge: 2015-05-12 | DRG: 357 | Disposition: A | Discharge status: Home / Self Care | Payer: Medicare Other | Attending: Internal Medicine | Admitting: Internal Medicine

## 2015-05-09 DIAGNOSIS — K922 Gastrointestinal hemorrhage, unspecified: Secondary | ICD-10-CM

## 2015-05-09 DIAGNOSIS — K3189 Other diseases of stomach and duodenum: Secondary | ICD-10-CM | POA: Diagnosis present

## 2015-05-09 DIAGNOSIS — I8511 Secondary esophageal varices with bleeding: Principal | ICD-10-CM | POA: Diagnosis present

## 2015-05-09 DIAGNOSIS — K8020 Calculus of gallbladder without cholecystitis without obstruction: Secondary | ICD-10-CM | POA: Diagnosis present

## 2015-05-09 DIAGNOSIS — K7460 Unspecified cirrhosis of liver: Secondary | ICD-10-CM | POA: Diagnosis present

## 2015-05-09 DIAGNOSIS — K529 Noninfective gastroenteritis and colitis, unspecified: Secondary | ICD-10-CM | POA: Diagnosis present

## 2015-05-09 DIAGNOSIS — Z789 Other specified health status: Secondary | ICD-10-CM | POA: Diagnosis present

## 2015-05-09 DIAGNOSIS — J45909 Unspecified asthma, uncomplicated: Secondary | ICD-10-CM | POA: Diagnosis present

## 2015-05-09 DIAGNOSIS — Z7289 Other problems related to lifestyle: Secondary | ICD-10-CM | POA: Diagnosis present

## 2015-05-09 DIAGNOSIS — D696 Thrombocytopenia, unspecified: Secondary | ICD-10-CM | POA: Diagnosis present

## 2015-05-09 DIAGNOSIS — F1020 Alcohol dependence, uncomplicated: Secondary | ICD-10-CM | POA: Diagnosis present

## 2015-05-09 DIAGNOSIS — K921 Melena: Secondary | ICD-10-CM | POA: Diagnosis present

## 2015-05-09 DIAGNOSIS — R109 Unspecified abdominal pain: Secondary | ICD-10-CM | POA: Diagnosis present

## 2015-05-09 DIAGNOSIS — J449 Chronic obstructive pulmonary disease, unspecified: Secondary | ICD-10-CM | POA: Diagnosis present

## 2015-05-09 DIAGNOSIS — K7030 Alcoholic cirrhosis of liver without ascites: Secondary | ICD-10-CM | POA: Diagnosis present

## 2015-05-09 DIAGNOSIS — R251 Tremor, unspecified: Secondary | ICD-10-CM | POA: Diagnosis not present

## 2015-05-09 DIAGNOSIS — E440 Moderate protein-calorie malnutrition: Secondary | ICD-10-CM | POA: Diagnosis present

## 2015-05-09 DIAGNOSIS — K766 Portal hypertension: Secondary | ICD-10-CM | POA: Diagnosis present

## 2015-05-09 DIAGNOSIS — F17210 Nicotine dependence, cigarettes, uncomplicated: Secondary | ICD-10-CM | POA: Diagnosis present

## 2015-05-09 HISTORY — DX: Chronic obstructive pulmonary disease, unspecified: J44.9

## 2015-05-09 HISTORY — DX: Pain in left knee: M25.562

## 2015-05-09 HISTORY — DX: Pain in right hip: M25.551

## 2015-05-09 HISTORY — DX: Pneumothorax, unspecified: J93.9

## 2015-05-09 HISTORY — DX: Unspecified cirrhosis of liver: K74.60

## 2015-05-09 HISTORY — DX: Unspecified asthma, uncomplicated: J45.909

## 2015-05-09 NOTE — ED Notes (Signed)
Pt presents to ER alert and in NAD. Pt reports he is "passing dark red blood" in stool x 2 days. Pt c/o abd pain.

## 2015-05-10 ENCOUNTER — Encounter: Payer: Self-pay | Admitting: Emergency Medicine

## 2015-05-10 ENCOUNTER — Emergency Department: Payer: Medicare Other

## 2015-05-10 DIAGNOSIS — K8020 Calculus of gallbladder without cholecystitis without obstruction: Secondary | ICD-10-CM | POA: Diagnosis present

## 2015-05-10 DIAGNOSIS — R109 Unspecified abdominal pain: Secondary | ICD-10-CM | POA: Diagnosis present

## 2015-05-10 DIAGNOSIS — K921 Melena: Secondary | ICD-10-CM | POA: Diagnosis present

## 2015-05-10 DIAGNOSIS — D696 Thrombocytopenia, unspecified: Secondary | ICD-10-CM | POA: Diagnosis present

## 2015-05-10 DIAGNOSIS — E440 Moderate protein-calorie malnutrition: Secondary | ICD-10-CM | POA: Diagnosis present

## 2015-05-10 DIAGNOSIS — K529 Noninfective gastroenteritis and colitis, unspecified: Secondary | ICD-10-CM | POA: Diagnosis present

## 2015-05-10 DIAGNOSIS — R251 Tremor, unspecified: Secondary | ICD-10-CM | POA: Diagnosis not present

## 2015-05-10 DIAGNOSIS — F17210 Nicotine dependence, cigarettes, uncomplicated: Secondary | ICD-10-CM | POA: Diagnosis present

## 2015-05-10 DIAGNOSIS — K3189 Other diseases of stomach and duodenum: Secondary | ICD-10-CM | POA: Diagnosis present

## 2015-05-10 DIAGNOSIS — J449 Chronic obstructive pulmonary disease, unspecified: Secondary | ICD-10-CM | POA: Diagnosis present

## 2015-05-10 DIAGNOSIS — K922 Gastrointestinal hemorrhage, unspecified: Secondary | ICD-10-CM | POA: Diagnosis present

## 2015-05-10 DIAGNOSIS — F1020 Alcohol dependence, uncomplicated: Secondary | ICD-10-CM | POA: Diagnosis present

## 2015-05-10 DIAGNOSIS — I8511 Secondary esophageal varices with bleeding: Secondary | ICD-10-CM | POA: Diagnosis present

## 2015-05-10 DIAGNOSIS — Z7289 Other problems related to lifestyle: Secondary | ICD-10-CM | POA: Diagnosis present

## 2015-05-10 DIAGNOSIS — K766 Portal hypertension: Secondary | ICD-10-CM | POA: Diagnosis present

## 2015-05-10 DIAGNOSIS — K7460 Unspecified cirrhosis of liver: Secondary | ICD-10-CM | POA: Diagnosis present

## 2015-05-10 DIAGNOSIS — K7030 Alcoholic cirrhosis of liver without ascites: Secondary | ICD-10-CM | POA: Diagnosis present

## 2015-05-10 DIAGNOSIS — Z789 Other specified health status: Secondary | ICD-10-CM | POA: Diagnosis present

## 2015-05-10 DIAGNOSIS — J45909 Unspecified asthma, uncomplicated: Secondary | ICD-10-CM | POA: Diagnosis present

## 2015-05-10 LAB — CBC WITH DIFFERENTIAL/PLATELET
BASOS ABS: 0.1 10*3/uL (ref 0–0.1)
Basophils Relative: 1 %
Eosinophils Absolute: 0 10*3/uL (ref 0–0.7)
HCT: 35.9 % — ABNORMAL LOW (ref 40.0–52.0)
HEMOGLOBIN: 12.2 g/dL — AB (ref 13.0–18.0)
LYMPHS ABS: 0.8 10*3/uL — AB (ref 1.0–3.6)
Lymphocytes Relative: 15 %
MCH: 37.6 pg — AB (ref 26.0–34.0)
MCHC: 34.1 g/dL (ref 32.0–36.0)
MCV: 110.2 fL — AB (ref 80.0–100.0)
MONO ABS: 1.1 10*3/uL — AB (ref 0.2–1.0)
NEUTROS ABS: 3.5 10*3/uL (ref 1.4–6.5)
Platelets: 43 10*3/uL — ABNORMAL LOW (ref 150–440)
RBC: 3.26 MIL/uL — AB (ref 4.40–5.90)
RDW: 13.7 % (ref 11.5–14.5)
WBC: 5.5 10*3/uL (ref 3.8–10.6)

## 2015-05-10 LAB — CBC
HCT: 33.8 % — ABNORMAL LOW (ref 40.0–52.0)
HCT: 33.5 % — ABNORMAL LOW (ref 40.0–52.0)
HEMATOCRIT: 34.7 % — AB (ref 40.0–52.0)
HEMOGLOBIN: 11.8 g/dL — AB (ref 13.0–18.0)
HEMOGLOBIN: 11.5 g/dL — AB (ref 13.0–18.0)
Hemoglobin: 11.3 g/dL — ABNORMAL LOW (ref 13.0–18.0)
MCH: 37.4 pg — AB (ref 26.0–34.0)
MCH: 37.7 pg — AB (ref 26.0–34.0)
MCH: 37.5 pg — AB (ref 26.0–34.0)
MCHC: 34 g/dL (ref 32.0–36.0)
MCHC: 34 g/dL (ref 32.0–36.0)
MCHC: 33.7 g/dL (ref 32.0–36.0)
MCV: 110.2 fL — ABNORMAL HIGH (ref 80.0–100.0)
MCV: 110.9 fL — ABNORMAL HIGH (ref 80.0–100.0)
MCV: 111.3 fL — AB (ref 80.0–100.0)
PLATELETS: 44 10*3/uL — AB (ref 150–440)
Platelets: 45 10*3/uL — ABNORMAL LOW (ref 150–440)
Platelets: 47 10*3/uL — ABNORMAL LOW (ref 150–440)
RBC: 3.15 MIL/uL — ABNORMAL LOW (ref 4.40–5.90)
RBC: 3.04 MIL/uL — ABNORMAL LOW (ref 4.40–5.90)
RBC: 3.01 MIL/uL — AB (ref 4.40–5.90)
RDW: 14 % (ref 11.5–14.5)
RDW: 13.6 % (ref 11.5–14.5)
RDW: 13.4 % (ref 11.5–14.5)
WBC: 5.1 10*3/uL (ref 3.8–10.6)
WBC: 4.5 10*3/uL (ref 3.8–10.6)
WBC: 4.2 10*3/uL (ref 3.8–10.6)

## 2015-05-10 LAB — COMPREHENSIVE METABOLIC PANEL
ALBUMIN: 4.1 g/dL (ref 3.5–5.0)
ALK PHOS: 93 U/L (ref 38–126)
ALT: 30 U/L (ref 17–63)
ALT: 28 U/L (ref 17–63)
AST: 72 U/L — ABNORMAL HIGH (ref 15–41)
AST: 75 U/L — AB (ref 15–41)
Albumin: 3.7 g/dL (ref 3.5–5.0)
Alkaline Phosphatase: 101 U/L (ref 38–126)
Anion gap: 11 (ref 5–15)
Anion gap: 11 (ref 5–15)
BUN: 10 mg/dL (ref 6–20)
BUN: 10 mg/dL (ref 6–20)
CALCIUM: 8.4 mg/dL — AB (ref 8.9–10.3)
CO2: 25 mmol/L (ref 22–32)
CO2: 25 mmol/L (ref 22–32)
Calcium: 8.9 mg/dL (ref 8.9–10.3)
Chloride: 97 mmol/L — ABNORMAL LOW (ref 101–111)
Chloride: 99 mmol/L — ABNORMAL LOW (ref 101–111)
Creatinine, Ser: 0.39 mg/dL — ABNORMAL LOW (ref 0.61–1.24)
Creatinine, Ser: 0.43 mg/dL — ABNORMAL LOW (ref 0.61–1.24)
GFR calc non Af Amer: >60 mL/min (ref >60)
GFR calc non Af Amer: >60 mL/min (ref >60)
GLUCOSE: 84 mg/dL (ref 65–99)
Glucose, Bld: 88 mg/dL (ref 65–99)
Potassium: 3.8 mmol/L (ref 3.5–5.1)
Potassium: 4.1 mmol/L (ref 3.5–5.1)
Sodium: 133 mmol/L — ABNORMAL LOW (ref 135–145)
Sodium: 135 mmol/L (ref 135–145)
Total Bilirubin: 1.4 mg/dL — ABNORMAL HIGH (ref 0.3–1.2)
Total Bilirubin: 1.6 mg/dL — ABNORMAL HIGH (ref 0.3–1.2)
Total Protein: 7.8 g/dL (ref 6.5–8.1)
Total Protein: 7.3 g/dL (ref 6.5–8.1)

## 2015-05-10 LAB — PROTIME-INR
INR: 1.07
Prothrombin Time: 14.1 seconds (ref 11.4–15.0)

## 2015-05-10 LAB — TROPONIN I

## 2015-05-10 LAB — AMMONIA: AMMONIA: 15 umol/L (ref 9–35)

## 2015-05-10 LAB — LIPASE, BLOOD: Lipase: 27 U/L (ref 22–51)

## 2015-05-10 MED ORDER — LORAZEPAM 1 MG PO TABS: 1.0000 mg | ORAL_TABLET | Freq: Four times a day (QID) | ORAL | Status: DC | PRN

## 2015-05-10 MED ORDER — IOHEXOL 300 MG/ML  SOLN
100.0000 mL | Freq: Once | INTRAMUSCULAR | Status: AC | PRN
  Administered 2015-05-10: 100 mL via INTRAVENOUS

## 2015-05-10 MED ORDER — FOLIC ACID 1 MG PO TABS
1.0000 mg | ORAL_TABLET | Freq: Every day | ORAL | Status: DC
  Administered 2015-05-10 – 2015-05-12 (×3): 1 mg via ORAL
  Filled 2015-05-10 (×3): qty 1

## 2015-05-10 MED ORDER — ADULT MULTIVITAMIN W/MINERALS CH
1.0000 | ORAL_TABLET | Freq: Every day | ORAL | Status: DC
  Administered 2015-05-10 – 2015-05-12 (×3): 1 via ORAL
  Filled 2015-05-10 (×3): qty 1

## 2015-05-10 MED ORDER — SODIUM CHLORIDE 0.9 % IV SOLN
Freq: Once | INTRAVENOUS | Status: AC
  Administered 2015-05-10: 13:00:00 via INTRAVENOUS
  Filled 2015-05-10: qty 0.5

## 2015-05-10 MED ORDER — THIAMINE HCL 100 MG/ML IJ SOLN: 100.0000 mg | Freq: Every day | INTRAMUSCULAR | Status: DC

## 2015-05-10 MED ORDER — MORPHINE SULFATE 4 MG/ML IJ SOLN
INTRAMUSCULAR | Status: AC
  Administered 2015-05-10: 4 mg via INTRAVENOUS
  Filled 2015-05-10: qty 1

## 2015-05-10 MED ORDER — ONDANSETRON HCL 4 MG/2ML IJ SOLN
INTRAMUSCULAR | Status: AC
  Administered 2015-05-10: 4 mg via INTRAVENOUS
  Filled 2015-05-10: qty 2

## 2015-05-10 MED ORDER — CIPROFLOXACIN IN D5W 400 MG/200ML IV SOLN
400.0000 mg | Freq: Two times a day (BID) | INTRAVENOUS | Status: DC
Start: 2015-05-10 — End: 2015-05-12
  Administered 2015-05-10 – 2015-05-12 (×5): 400 mg via INTRAVENOUS
  Filled 2015-05-10 (×7): qty 200

## 2015-05-10 MED ORDER — ONDANSETRON HCL 4 MG/2ML IJ SOLN
4.0000 mg | Freq: Four times a day (QID) | INTRAMUSCULAR | Status: DC | PRN
  Administered 2015-05-11: 4 mg via INTRAVENOUS
  Filled 2015-05-10: qty 2

## 2015-05-10 MED ORDER — SODIUM CHLORIDE 0.9 % IV SOLN
8.0000 mg/h | INTRAVENOUS | Status: DC
  Administered 2015-05-10 – 2015-05-12 (×4): 8 mg/h via INTRAVENOUS
  Filled 2015-05-10 (×5): qty 80

## 2015-05-10 MED ORDER — HYDROXYZINE HCL 10 MG PO TABS
25.0000 mg | ORAL_TABLET | Freq: Four times a day (QID) | ORAL | Status: DC | PRN
Start: 2015-05-10 — End: 2015-05-12

## 2015-05-10 MED ORDER — MONTELUKAST SODIUM 10 MG PO TABS
10.0000 mg | ORAL_TABLET | Freq: Every day | ORAL | Status: DC
  Administered 2015-05-10 – 2015-05-11 (×2): 10 mg via ORAL
  Filled 2015-05-10 (×2): qty 1

## 2015-05-10 MED ORDER — SODIUM CHLORIDE 0.9 % IV BOLUS (SEPSIS)
500.0000 mL | Freq: Once | INTRAVENOUS | Status: AC
  Administered 2015-05-10: 500 mL via INTRAVENOUS

## 2015-05-10 MED ORDER — PANTOPRAZOLE SODIUM 40 MG IV SOLR
INTRAVENOUS | Status: AC
  Filled 2015-05-10: qty 40

## 2015-05-10 MED ORDER — OCTREOTIDE LOAD VIA INFUSION
50.0000 ug | Freq: Once | INTRAVENOUS | Status: DC
  Filled 2015-05-10 (×3): qty 25

## 2015-05-10 MED ORDER — ONDANSETRON HCL 4 MG/2ML IJ SOLN
4.0000 mg | Freq: Once | INTRAMUSCULAR | Status: AC
  Administered 2015-05-10: 4 mg via INTRAVENOUS

## 2015-05-10 MED ORDER — LORAZEPAM 2 MG/ML IJ SOLN
1.0000 mg | Freq: Four times a day (QID) | INTRAMUSCULAR | Status: DC | PRN
  Administered 2015-05-10 – 2015-05-11 (×6): 1 mg via INTRAVENOUS
  Filled 2015-05-10 (×6): qty 1

## 2015-05-10 MED ORDER — MORPHINE SULFATE 4 MG/ML IJ SOLN
4.0000 mg | Freq: Once | INTRAMUSCULAR | Status: AC
  Administered 2015-05-10: 4 mg via INTRAVENOUS

## 2015-05-10 MED ORDER — SERTRALINE HCL 50 MG PO TABS
50.0000 mg | ORAL_TABLET | Freq: Every day | ORAL | Status: DC
  Administered 2015-05-10 – 2015-05-12 (×3): 50 mg via ORAL
  Filled 2015-05-10 (×3): qty 1

## 2015-05-10 MED ORDER — HYDROCODONE-ACETAMINOPHEN 5-325 MG PO TABS
1.0000 | ORAL_TABLET | ORAL | Status: DC | PRN
  Administered 2015-05-10: 2 via ORAL
  Administered 2015-05-10: 1 via ORAL
  Administered 2015-05-11 – 2015-05-12 (×3): 2 via ORAL
  Filled 2015-05-10: qty 1
  Filled 2015-05-10 (×4): qty 2

## 2015-05-10 MED ORDER — IOHEXOL 240 MG/ML SOLN
25.0000 mL | INTRAMUSCULAR | Status: AC
Start: 2015-05-10 — End: 2015-05-10
  Administered 2015-05-10: 25 mL via ORAL

## 2015-05-10 MED ORDER — SODIUM CHLORIDE 0.9 % IV SOLN
50.0000 ug/h | INTRAVENOUS | Status: DC
  Administered 2015-05-10 – 2015-05-12 (×4): 50 ug/h via INTRAVENOUS
  Filled 2015-05-10 (×11): qty 1

## 2015-05-10 MED ORDER — METRONIDAZOLE IN NACL 5-0.79 MG/ML-% IV SOLN
500.0000 mg | Freq: Three times a day (TID) | INTRAVENOUS | Status: DC
  Administered 2015-05-10 – 2015-05-12 (×7): 500 mg via INTRAVENOUS
  Filled 2015-05-10 (×11): qty 100

## 2015-05-10 MED ORDER — ONDANSETRON HCL 4 MG PO TABS: 4.0000 mg | ORAL_TABLET | Freq: Four times a day (QID) | ORAL | Status: DC | PRN

## 2015-05-10 MED ORDER — VITAMIN B-1 100 MG PO TABS
100.0000 mg | ORAL_TABLET | Freq: Every day | ORAL | Status: DC
  Administered 2015-05-10 – 2015-05-12 (×3): 100 mg via ORAL
  Filled 2015-05-10 (×3): qty 1

## 2015-05-10 MED ORDER — IPRATROPIUM-ALBUTEROL 0.5-2.5 (3) MG/3ML IN SOLN
3.0000 mL | RESPIRATORY_TRACT | Status: DC
  Administered 2015-05-10 (×4): 3 mL via RESPIRATORY_TRACT
  Filled 2015-05-10 (×4): qty 3

## 2015-05-10 MED ORDER — SODIUM CHLORIDE 0.9 % IV SOLN
80.0000 mg | Freq: Once | INTRAVENOUS | Status: AC
  Administered 2015-05-10: 80 mg via INTRAVENOUS
  Filled 2015-05-10: qty 80

## 2015-05-10 MED ORDER — TIOTROPIUM BROMIDE MONOHYDRATE 18 MCG IN CAPS
18.0000 ug | ORAL_CAPSULE | Freq: Every day | RESPIRATORY_TRACT | Status: DC
  Administered 2015-05-11 – 2015-05-12 (×2): 18 ug via RESPIRATORY_TRACT
  Filled 2015-05-10: qty 5

## 2015-05-10 NOTE — Consult Note (Signed)
Surgical Consultation  05/10/2015  Albert Fowler is an 54 y.o. male.   CC: Abdominal pain  HPI: Patient states he started with abdominal pain this morning. He has had similar pains in the past but none this severe. He's been having dark stools for the last 2 days. He's been nauseated but has not vomited. He denies fevers or chills. He states that he stopped drinking yesterday. Admits to drinking 1-1/2 pints of alcohol per day until yesterday when he only had beer.  Patient has documented cirrhosis with esophageal varices likely secondary to alcoholism.  Past Medical History  Diagnosis Date  . COPD (chronic obstructive pulmonary disease)   . Asthma   . Cirrhosis   . Pneumothorax   . Left knee pain   . Right hip pain     Past Surgical History  Procedure Laterality Date  . No past surgeries      Family History  Problem Relation Age of Onset  . COPD Mother   . Cancer Father   . Cancer Brother     Social History:  reports that he has been smoking.  He has never used smokeless tobacco. He reports that he drinks alcohol. He reports that he does not use illicit drugs.  Allergies: No Known Allergies  Medications reviewed.   Review of Systems:   Review of Systems  Constitutional: Positive for malaise/fatigue. Negative for fever and chills.  HENT: Negative.   Eyes: Negative.   Respiratory: Negative for cough, hemoptysis and shortness of breath.   Cardiovascular: Positive for leg swelling. Negative for chest pain.  Gastrointestinal: Positive for nausea, abdominal pain, diarrhea and melena. Negative for heartburn, vomiting and constipation.  Genitourinary: Negative for dysuria.  Musculoskeletal: Negative.   Skin: Negative for rash.  Neurological: Positive for weakness.  Endo/Heme/Allergies: Negative.   Psychiatric/Behavioral: Negative.      Physical Exam:  BP 128/68 mmHg  Pulse 75  Temp(Src) 98.9 F (37.2 C) (Oral)  Resp 18  Ht _0  (1.575 m)  Wt 54.432 kg (120  lb)  BMI 21.94 kg/m2  SpO2 95%  Physical Exam  Constitutional: He is oriented to person, place, and time and well-developed, well-nourished, and in no distress.  HENT:  Head: Normocephalic and atraumatic.  Eyes: No scleral icterus.  Neck: Normal range of motion.  Cardiovascular: Normal rate, regular rhythm and normal heart sounds.   Pulmonary/Chest: Effort normal. No respiratory distress. He has wheezes. He exhibits no tenderness.  Abdominal: Soft. He exhibits distension. There is tenderness. There is no rebound and no guarding.  Musculoskeletal: Normal range of motion.  Neurological: He is alert and oriented to person, place, and time.  Skin: Skin is warm and dry.      Results for orders placed or performed during the hospital encounter of 05/09/15 (from the past 48 hour(s))  CBC with Differential     Status: Abnormal   Collection Time: 05/09/15 11:31 PM  Result Value Ref Range   WBC 5.5 3.8 - 10.6 K/uL   RBC 3.26 (L) 4.40 - 5.90 MIL/uL   Hemoglobin 12.2 (L) 13.0 - 18.0 g/dL   HCT 35.9 (L) 40.0 - 52.0 %   MCV 110.2 (H) 80.0 - 100.0 fL   MCH 37.6 (H) 26.0 - 34.0 pg   MCHC 34.1 32.0 - 36.0 g/dL   RDW 13.7 11.5 - 14.5 %   Platelets 43 (L) 150 - 440 K/uL   Neutrophils Relative % 62% %   Neutro Abs 3.5 1.4 - 6.5 K/uL  Lymphocytes Relative 15% %   Lymphs Abs 0.8 (L) 1.0 - 3.6 K/uL   Monocytes Relative 21% %   Monocytes Absolute 1.1 (H) 0.2 - 1.0 K/uL   Eosinophils Relative 1% %   Eosinophils Absolute 0.0 0 - 0.7 K/uL   Basophils Relative 1% %   Basophils Absolute 0.1 0 - 0.1 K/uL  Comprehensive metabolic panel     Status: Abnormal   Collection Time: 05/09/15 11:31 PM  Result Value Ref Range   Sodium 133 (L) 135 - 145 mmol/L   Potassium 3.8 3.5 - 5.1 mmol/L   Chloride 97 (L) 101 - 111 mmol/L   CO2 25 22 - 32 mmol/L   Glucose, Bld 84 65 - 99 mg/dL   BUN 10 6 - 20 mg/dL   Creatinine, Ser 0.39 (L) 0.61 - 1.24 mg/dL   Calcium 8.9 8.9 - 10.3 mg/dL   Total Protein 7.8 6.5 -  8.1 g/dL   Albumin 4.1 3.5 - 5.0 g/dL   AST 72 (H) 15 - 41 U/L   ALT 30 17 - 63 U/L   Alkaline Phosphatase 101 38 - 126 U/L   Total Bilirubin 1.4 (H) 0.3 - 1.2 mg/dL   GFR calc non Af Amer >60 >60 mL/min   GFR calc Af Amer >60 >60 mL/min    Comment: (NOTE) The eGFR has been calculated using the CKD EPI equation. This calculation has not been validated in all clinical situations. eGFR's persistently <60 mL/min signify possible Chronic Kidney Disease.    Anion gap 11 5 - 15  Lipase, blood     Status: None   Collection Time: 05/09/15 11:31 PM  Result Value Ref Range   Lipase 27 22 - 51 U/L  Troponin I     Status: None   Collection Time: 05/09/15 11:31 PM  Result Value Ref Range   Troponin I <0.03 <0.031 ng/mL    Comment:        NO INDICATION OF MYOCARDIAL INJURY.   Ammonia     Status: None   Collection Time: 05/10/15  1:10 AM  Result Value Ref Range   Ammonia 15 9 - 35 umol/L  Comprehensive metabolic panel     Status: Abnormal   Collection Time: 05/10/15  6:58 AM  Result Value Ref Range   Sodium 135 135 - 145 mmol/L   Potassium 4.1 3.5 - 5.1 mmol/L   Chloride 99 (L) 101 - 111 mmol/L   CO2 25 22 - 32 mmol/L   Glucose, Bld 88 65 - 99 mg/dL   BUN 10 6 - 20 mg/dL   Creatinine, Ser 0.43 (L) 0.61 - 1.24 mg/dL   Calcium 8.4 (L) 8.9 - 10.3 mg/dL   Total Protein 7.3 6.5 - 8.1 g/dL   Albumin 3.7 3.5 - 5.0 g/dL   AST 75 (H) 15 - 41 U/L   ALT 28 17 - 63 U/L   Alkaline Phosphatase 93 38 - 126 U/L   Total Bilirubin 1.6 (H) 0.3 - 1.2 mg/dL   GFR calc non Af Amer >60 >60 mL/min   GFR calc Af Amer >60 >60 mL/min    Comment: (NOTE) The eGFR has been calculated using the CKD EPI equation. This calculation has not been validated in all clinical situations. eGFR's persistently <60 mL/min signify possible Chronic Kidney Disease.    Anion gap 11 5 - 15  CBC     Status: Abnormal   Collection Time: 05/10/15  6:58 AM  Result Value Ref Range  WBC 5.1 3.8 - 10.6 K/uL   RBC 3.15 (L)  4.40 - 5.90 MIL/uL   Hemoglobin 11.8 (L) 13.0 - 18.0 g/dL   HCT 34.7 (L) 40.0 - 52.0 %   MCV 110.2 (H) 80.0 - 100.0 fL   MCH 37.4 (H) 26.0 - 34.0 pg   MCHC 34.0 32.0 - 36.0 g/dL   RDW 14.0 11.5 - 14.5 %   Platelets 45 (L) 150 - 440 K/uL   Ct Abdomen Pelvis W Contrast  05/10/2015   CLINICAL DATA:  Blood in stools for 2 days.  Abdominal pain.  EXAM: CT ABDOMEN AND PELVIS WITH CONTRAST  TECHNIQUE: Multidetector CT imaging of the abdomen and pelvis was performed using the standard protocol following bolus administration of intravenous contrast.  CONTRAST:  174m OMNIPAQUE IOHEXOL 300 MG/ML  SOLN  COMPARISON:  02/07/2013  FINDINGS: Lung bases are clear. Previous pleural effusions and focal opacities are resolved. Small esophageal hiatal hernia. Hair esophageal varices are present.  Diffuse fatty infiltration of the liver. Hepatic cirrhosis with enlarged lateral segment left and caudate lobes and nodular contour. No focal liver lesions. Cholelithiasis and gallbladder sludge with mild gallbladder wall thickening. This could be due to cirrhosis or cholecystitis. Varices demonstrated around the stomach and splenic artery. Calcified granulomas in the spleen. Spleen is not enlarged. Small accessory spleen. Pancreas and adrenal glands, kidneys, abdominal aorta, inferior vena cava, and retroperitoneal lymph nodes are unremarkable. Stomach, small bowel, and colon are not abnormally distended. Contrast material flows through to the colon without evidence of small bowel obstruction. Suggestion of wall thickening in the right colon which may indicate enteritis. No free air or free fluid in the abdomen.  Pelvis: Appendix is normal. Bladder wall is not thickened. Prostate gland is not enlarged. No free or loculated pelvic fluid collections. No pelvic mass or lymphadenopathy. Degenerative changes in the spine and hips, greater on the left hip. No destructive bone lesions.  IMPRESSION: Hepatic cirrhosis and fatty infiltration.  Diffuse upper abdominal and paraesophageal varices. Wall thickening in the right colon may indicate colitis. No bowel obstruction. Cholelithiasis and gallbladder sludge with mild gallbladder wall thickening.   Electronically Signed   By: WLucienne CapersM.D.   On: 05/10/2015 03:41    Assessment/Plan:  This patient with cirrhosis esophageal and gastric varices especially seen on CT scan which was personally reviewed. He has gallstones and may have a compound of colitis. The albumin appears fairly normal.  While this patient does not show signs of acute cholecystitis, even if he did he would not be a surgical candidate due to his severe esophageal gastric varices. May benefit from a GI consult or possibly even a transfer for a TIPS procedure if he is a candidate for that. I would be happy to follow the patient while he is in the hospital but I see no secure surgical needs at this time.  RFlorene Glen MD, FACS

## 2015-05-10 NOTE — Progress Notes (Signed)
Z. H. - Nurse from 1300-1900 Pt A&O VSS.  Received handoff report from Hardie PulleyLatonia W., RN At [phone]

## 2015-05-10 NOTE — ED Provider Notes (Signed)
[facility] Emergency Department Provider Note  ____________________________________________  Time seen: Approximately 1:23 AM  I have reviewed the triage vital signs and the nursing notes.   HISTORY  Chief Complaint Rectal Bleeding    HPI Albert GivensJames H Voit is a 54 y.o. male with history of "liver problems", alcoholism, COPD presents for evaluation of dark bloody stools. He reports this started yesterday evening. He had one episode yesterday as well as one episode today just prior to arrival. It is happening intermittently. He is also having diffuse abdominal pain. He has had nausea but no vomiting, no hematemesis, no fevers, no cough, sneezing or runny nose, no nasal congestion, no chest pain or difficulty breathing. No modifying factors.   Past Medical History  Diagnosis Date  . COPD (chronic obstructive pulmonary disease)   . Asthma   . Cirrhosis   . Pneumothorax   . Left knee pain   . Right hip pain     Patient Active Problem List   Diagnosis Date Noted  . Melena 05/10/2015  . Abdominal pain 05/10/2015  . Alcohol use 05/10/2015    History reviewed. No pertinent past surgical history.  Current Outpatient Rx  Name  Route  Sig  Dispense  Refill  . HYDROcodone-acetaminophen (NORCO/VICODIN) 5-325 MG per tablet   Oral   Take 1 tablet by mouth every 6 (six) hours as needed for moderate pain.           Allergies Review of patient's allergies indicates no known allergies.  History reviewed. No pertinent family history.  Social History History  Substance Use Topics  . Smoking status: Current Every Day Smoker  . Smokeless tobacco: Never Used  . Alcohol Use: Yes    Review of Systems Constitutional: No fever/chills Eyes: No visual changes. ENT: No sore throat. Cardiovascular: Denies chest pain. Respiratory: Denies shortness of breath. Gastrointestinal: + abdominal pain.  No nausea, no vomiting.  No diarrhea.  No  constipation. Genitourinary: Negative for dysuria. Musculoskeletal: Negative for back pain. Skin: Negative for rash. Neurological: Negative for headaches, focal weakness or numbness.  10-point ROS otherwise negative.  ____________________________________________   PHYSICAL EXAM:  VITAL SIGNS: ED Triage Vitals  Enc Vitals Group     BP 05/09/15 2338 149/82 mmHg     Pulse Rate 05/09/15 2338 97     Resp 05/09/15 2338 20     Temp 05/09/15 2338 98.1 F (36.7 C)     Temp Source 05/09/15 2338 Oral     SpO2 05/09/15 2338 95 %     Weight 05/09/15 2338 120 lb (54.432 kg)     Height 05/09/15 2338 5\' 2"  (1.575 m)     Head Cir --      Peak Flow --      Pain Score 05/09/15 2338 9     Pain Loc --      Pain Edu? --      Excl. in GC? --     Constitutional: Alert and oriented. Chronically ill-appearing and in no acute distress. Eyes: Conjunctivae are normal. PERRL. EOMI. Head: Atraumatic. Nose: No congestion/rhinnorhea. Mouth/Throat: Mucous membranes are moist.  Oropharynx non-erythematous. Neck: No stridor.   Cardiovascular: Normal rate, regular rhythm. Grossly normal heart sounds.  Good peripheral circulation. Respiratory: Normal respiratory effort.  No retractions. Lungs CTAB. Gastrointestinal: Mildly distended with diffuse tenderness, No abdominal bruits. No CVA tenderness. Rectal: Dark brown stool in the rectal vault is strongly guaiac positive. Genitourinary: deferred Musculoskeletal: No lower extremity tenderness nor edema.  No joint  effusions. Old/healing ecchymosis throughout bilateral arms without associative swelling or tenderness. Neurologic:  Normal speech and language. No gross focal neurologic deficits are appreciated. Speech is normal. No gait instability. Skin:  Skin is warm, dry and intact. Psychiatric: Mood and affect are normal. Speech and behavior are normal.  ____________________________________________   LABS (all labs ordered are listed, but only abnormal  results are displayed)  Labs Reviewed  CBC WITH DIFFERENTIAL/PLATELET - Abnormal; Notable for the following:    RBC 3.26 (*)    Hemoglobin 12.2 (*)    HCT 35.9 (*)    MCV 110.2 (*)    MCH 37.6 (*)    Platelets 43 (*)    Lymphs Abs 0.8 (*)    Monocytes Absolute 1.1 (*)    All other components within normal limits  COMPREHENSIVE METABOLIC PANEL - Abnormal; Notable for the following:    Sodium 133 (*)    Chloride 97 (*)    Creatinine, Ser 0.39 (*)    AST 72 (*)    Total Bilirubin 1.4 (*)    All other components within normal limits  LIPASE, BLOOD  AMMONIA  TROPONIN I  TYPE AND SCREEN   ____________________________________________  EKG  ED ECG REPORT   Date: 05/10/2015  EKG Time: 01:10  Rate: 79  Rhythm: normal EKG, normal sinus rhythm, unchanged from previous tracings  Axis: normal  Intervals:none  ST&T Change: none  ____________________________________________  RADIOLOGY  CT abd and pelvis: IMPRESSION: Hepatic cirrhosis and fatty infiltration. Diffuse upper abdominal and paraesophageal varices. Wall thickening in the right colon may indicate colitis. No bowel obstruction. Cholelithiasis and gallbladder sludge with mild gallbladder wall thickening. ____________________________________________   PROCEDURES  Procedure(s) performed: None  Critical Care performed: No  ____________________________________________   INITIAL IMPRESSION / ASSESSMENT AND PLAN / ED COURSE  Pertinent labs & imaging results that were available during my care of the patient were reviewed by me and considered in my medical decision making (see chart for details).  Albert GivensJames H Fowler is a 54 y.o. male with history of "liver problems", alcoholism, COPD presents for evaluation of dark bloody stools. Suspect upper GI bleed. Hemoglobin 12.2, vital signs stable. CT of the abdomen and pelvis showing diffuse varices. Patient was given Protonix bolus and started on a drip. CT gallbladder findings  were discussed with Dr. Egbert GaribaldiBird of general surgery who recommends no emergent surgical intervention at this time, especially in setting of active GI bleed. Hospitalist will admit.   ____________________________________________   FINAL CLINICAL IMPRESSION(S) / ED DIAGNOSES  Final diagnoses:  Acute upper GI bleed      Gayla DossEryka A Bluma Buresh, MD 05/10/15 [id number]

## 2015-05-10 NOTE — ED Notes (Signed)
Pt reports 6 months of heavy drinking. Pt had one episode of melena today. Pt last drink yesterday. Pt denies complicated with drawls in the past.

## 2015-05-10 NOTE — Consult Note (Signed)
GI Inpatient Consult Note  Reason for Consult: GI bleed   Attending Requesting Consult:  History of Present Illness: Talitha GivensJames H Izquierdo is a 54 y.o. male with hematochezia/melena for at least few days. Known hx of alcoholic cirrhosis. After wife passed away, resumed drinking a pint of liquor daily x 3 months or so. Also, smokes 1 1/2 PPD. Some dyspepsia and nausea. No hematemesis. Had EGD/colonoscopy by Dr. Marva PandaSkulskie in 7/12. Colon was normal. EGD showed grade 1 esophageal varices plus moderate portal gastropathy. No GI f/u since.  Past Medical History:  Past Medical History  Diagnosis Date  . COPD (chronic obstructive pulmonary disease)   . Asthma   . Cirrhosis   . Pneumothorax   . Left knee pain   . Right hip pain     Problem List: Patient Active Problem List   Diagnosis Date Noted  . Melena 05/10/2015  . Abdominal pain 05/10/2015  . Alcohol use 05/10/2015  . Cirrhosis of liver 05/10/2015  . Thrombocytopenia 05/10/2015  . COPD (chronic obstructive pulmonary disease) 05/10/2015    Past Surgical History: Past Surgical History  Procedure Laterality Date  . No past surgeries      Allergies: No Known Allergies  Home Medications: Prescriptions prior to admission  Medication Sig Dispense Refill Last Dose  . albuterol (PROVENTIL HFA;VENTOLIN HFA) 108 (90 BASE) MCG/ACT inhaler Inhale 2 puffs into the lungs 2 (two) times daily.     Marland Kitchen. albuterol (PROVENTIL HFA;VENTOLIN HFA) 108 (90 BASE) MCG/ACT inhaler Inhale 2 puffs into the lungs every 4 (four) hours as needed for wheezing or shortness of breath.     Marland Kitchen. HYDROcodone-acetaminophen (NORCO/VICODIN) 5-325 MG per tablet Take 1-2 tablets by mouth every 4 (four) hours as needed for moderate pain.      . hydrOXYzine (ATARAX/VISTARIL) 25 MG tablet Take 25 mg by mouth every 6 (six) hours as needed for itching.     . montelukast (SINGULAIR) 10 MG tablet Take 10 mg by mouth at bedtime.     . pantoprazole (PROTONIX) 40 MG tablet Take 40 mg by  mouth daily.     . sertraline (ZOLOFT) 50 MG tablet Take 50 mg by mouth daily.     Marland Kitchen. tiotropium (SPIRIVA) 18 MCG inhalation capsule Place 18 mcg into inhaler and inhale daily.      Home medication reconciliation was completed with the patient.   Scheduled Inpatient Medications:   . ciprofloxacin  400 mg Intravenous Q12H  . folic acid  1 mg Oral Daily  . ipratropium-albuterol  3 mL Nebulization Q4H  . metronidazole  500 mg Intravenous Q8H  . montelukast  10 mg Oral QHS  . multivitamin with minerals  1 tablet Oral Daily  . sertraline  50 mg Oral Daily  . thiamine  100 mg Oral Daily   Or  . thiamine  100 mg Intravenous Daily  . tiotropium  18 mcg Inhalation Daily    Continuous Inpatient Infusions:   . pantoprozole (PROTONIX) infusion 8 mg/hr (05/10/15 0302)    PRN Inpatient Medications:  HYDROcodone-acetaminophen, hydrOXYzine, LORazepam **OR** LORazepam, ondansetron **OR** ondansetron (ZOFRAN) IV  Family History: family history includes COPD in his mother; Cancer in his brother and father.  The patient's family history is negative for inflammatory bowel disorders, GI malignancy, or solid organ transplantation.  Social History:   reports that he has been smoking.  He has never used smokeless tobacco. He reports that he drinks alcohol. He reports that he does not use illicit drugs. The patient denies ETOH,  tobacco, or drug use.   Review of Systems: Constitutional: Weight is stable.  Eyes: No changes in vision. ENT: No oral lesions, sore throat.  GI: see HPI.  Heme/Lymph: No easy bruising.  CV: No chest pain.  GU: No hematuria.  Integumentary: No rashes.  Neuro: No headaches.  Psych: No depression/anxiety.  Endocrine: No heat/cold intolerance.  Allergic/Immunologic: No urticaria.  Resp: No cough, SOB.  Musculoskeletal: No joint swelling.    Physical Examination: BP 128/68 mmHg  Pulse 81  Temp(Src) 98.9 F (37.2 C) (Oral)  Resp 18  Ht 5\' 2"  (1.575 m)  Wt 54.432 kg  (120 lb)  BMI 21.94 kg/m2  SpO2 95% Gen: NAD, alert and oriented x 4 HEENT: PEERLA, EOMI, Neck: supple, no JVD or thyromegaly Chest: CTA bilaterally, no wheezes, crackles, or other adventitious sounds CV: RRR, no m/g/c/r Abd: soft, mild upper abdominal tenderness, ND, +BS in all four quadrants; no HSM, guarding, ridigity, or rebound tenderness Ext: no edema, well perfused with 2+ pulses, Skin: no rash or lesions noted Lymph: no LAD  Data: Lab Results  Component Value Date   WBC 5.1 05/10/2015   HGB 11.8* 05/10/2015   HCT 34.7* 05/10/2015   MCV 110.2* 05/10/2015   PLT 45* 05/10/2015    Recent Labs Lab 05/09/15 2331 05/10/15 0658  HGB 12.2* 11.8*   Lab Results  Component Value Date   NA 135 05/10/2015   K 4.1 05/10/2015   CL 99* 05/10/2015   CO2 25 05/10/2015   BUN 10 05/10/2015   CREATININE 0.43* 05/10/2015   Lab Results  Component Value Date   ALT 28 05/10/2015   AST 75* 05/10/2015   ALKPHOS 93 05/10/2015   BILITOT 1.6* 05/10/2015   No results for input(s): APTT, INR, PTT in the last 168 hours. Assessment/Plan: Mr. Lanae BoastGarner is a 54 y.o. male with likely UGI bleeding. No active bleeding right now.   Recommendations: Continue protonix IV. Plan EGD tomorrow with possible esophageal banding. If active bleeding, then start octreotide drip after bolus. If not, may need nadolol or inderal post EGD. Will follow.   Thank you for the consult. Please call with questions or concerns.  Royale Swamy, Ezzard StandingPAUL Y, MD

## 2015-05-10 NOTE — ED Notes (Signed)
Called pt's daughter to inform of room assignment.

## 2015-05-10 NOTE — H&P (Signed)
[facility] [place] at [facility]   PATIENT NAME: Albert Fowler    MR#:  [id number]  DATE OF BIRTH:  03/24/1961  DATE OF ADMISSION:  05/09/2015  PRIMARY CARE PHYSICIAN: Thomes DinningWEEKS,CYNTHIA, MD   REQUESTING/REFERRING PHYSICIAN: Raymon MuttonErika Gayle  CHIEF COMPLAINT:   Chief Complaint  Patient presents with  . Rectal Bleeding    Pt presents to ER alert and in NAD. Pt reports he is "passing dark red blood" in stool x 2 days. Pt c/o abd pain.    HISTORY OF PRESENT ILLNESS:  Albert Fowler  is a 54 y.o. male with a below mentioned past medical history presents to the emergency room with the complaints of passing dark red/black stool for the past 2 days. Also complains of diffuse mild abdominal pain for the past 2-3 days. Denies any fever, does has some nausea but no vomitings or hematemesis. Denies any chest pain, shortness of breath, dizziness, palpitations, loss of consciousness. No focal weakness or numbness. In the emergency room patient was evaluated by the ED physician and was found to be with stable vital signs and heme positive on rectal examination. Lab work significant for hemoglobin and hematocrit of 12.2 over 35.9, total bili 1.4, AST 72, platelets 43. CT of the abdomen significant for esophageal varices, fatty infiltration of liver with cirrhosis, cholelithiasis with sludge with mild gallbladder wall thickening, mild colonic wall thickening suggestive of possible colitis. Patient was started on IV Protonix drip, surgery on call was consulted by the ED physician because of cholelithiasis and possible cholecystitis and per ED physician and surgeon opinions no need for gallbladder surgery at this time. Hence hospitalists service was consulted for further evaluation and management. Patient received IV pain medication and following which his abdominal pain is under reasonable control at this time. PAST MEDICAL HISTORY:   Past Medical History  Diagnosis Date  . COPD (chronic  obstructive pulmonary disease)   . Asthma   . Cirrhosis   . Pneumothorax   . Left knee pain   . Right hip pain     PAST SURGICAL HISTORY:   Past Surgical History  Procedure Laterality Date  . No past surgeries      SOCIAL HISTORY:   History  Substance Use Topics  . Smoking status: Current Every Day Smoker  . Smokeless tobacco: Never Used  . Alcohol Use: Yes    FAMILY HISTORY:   Family History  Problem Relation Age of Onset  . COPD Mother   . Cancer Father   . Cancer Brother     DRUG ALLERGIES:  No Known Allergies  REVIEW OF SYSTEMS:   Review of Systems  Constitutional: Negative for fever, chills and malaise/fatigue.  HENT: Negative for ear pain, hearing loss, nosebleeds, sore throat and tinnitus.   Eyes: Negative for blurred vision, double vision, pain, discharge and redness.  Respiratory: Negative for cough, hemoptysis, sputum production, shortness of breath and wheezing.   Cardiovascular: Negative for chest pain, palpitations, orthopnea and leg swelling.  Gastrointestinal: Positive for nausea, abdominal pain, blood in stool and melena. Negative for vomiting, diarrhea and constipation.  Genitourinary: Negative for dysuria, urgency, frequency and hematuria.  Musculoskeletal: Positive for joint pain. Negative for back pain and neck pain.  Skin: Negative for itching and rash.  Neurological: Negative for dizziness, tingling, sensory change, focal weakness and seizures.  Endo/Heme/Allergies: Bruises/bleeds easily.  Psychiatric/Behavioral: Negative for depression. The patient is not nervous/anxious.     MEDICATIONS AT HOME:   Prior to Admission medications  Medication Sig Start Date End Date Taking? Authorizing Provider  albuterol (PROVENTIL HFA;VENTOLIN HFA) 108 (90 BASE) MCG/ACT inhaler Inhale 2 puffs into the lungs 2 (two) times daily.   Yes Historical Provider, MD  albuterol (PROVENTIL HFA;VENTOLIN HFA) 108 (90 BASE) MCG/ACT inhaler Inhale 2 puffs into the  lungs every 4 (four) hours as needed for wheezing or shortness of breath.   Yes Historical Provider, MD  HYDROcodone-acetaminophen (NORCO/VICODIN) 5-325 MG per tablet Take 1-2 tablets by mouth every 4 (four) hours as needed for moderate pain.    Yes Historical Provider, MD  hydrOXYzine (ATARAX/VISTARIL) 25 MG tablet Take 25 mg by mouth every 6 (six) hours as needed for itching.   Yes Historical Provider, MD  montelukast (SINGULAIR) 10 MG tablet Take 10 mg by mouth at bedtime.   Yes Historical Provider, MD  pantoprazole (PROTONIX) 40 MG tablet Take 40 mg by mouth daily.   Yes Historical Provider, MD  sertraline (ZOLOFT) 50 MG tablet Take 50 mg by mouth daily.   Yes Historical Provider, MD  tiotropium (SPIRIVA) 18 MCG inhalation capsule Place 18 mcg into inhaler and inhale daily.   Yes Historical Provider, MD      VITAL SIGNS:  Blood pressure 162/90, pulse 85, temperature 98.1 F (36.7 C), temperature source Oral, resp. rate 16, height 5\' 2"  (1.575 m), weight 54.432 kg (120 lb), SpO2 98 %.  PHYSICAL EXAMINATION:  Physical Exam  Constitutional: He is oriented to person, place, and time. He appears well-developed. No distress.  HENT:  Head: Normocephalic and atraumatic.  Right Ear: External ear normal.  Left Ear: External ear normal.  Nose: Nose normal.  Mouth/Throat: Oropharynx is clear and moist. No oropharyngeal exudate.  Eyes: EOM are normal. Pupils are equal, round, and reactive to light. No scleral icterus.  Neck: Normal range of motion. Neck supple. No JVD present. No thyromegaly present.  Cardiovascular: Normal rate, regular rhythm, normal heart sounds and intact distal pulses.  Exam reveals no friction rub.   No murmur heard. Respiratory: Effort normal and breath sounds normal. No respiratory distress. He has no wheezes. He has no rales. He exhibits no tenderness.  GI: Soft. Bowel sounds are normal. He exhibits no distension and no mass. There is no rebound and no guarding.  Mild  diffuse abdominal tenderness +  Musculoskeletal: Normal range of motion. He exhibits no edema.  Lymphadenopathy:    He has no cervical adenopathy.  Neurological: He is alert and oriented to person, place, and time. He has normal reflexes. He displays normal reflexes. No cranial nerve deficit. He exhibits normal muscle tone.  Skin: Skin is warm. No rash noted. No erythema.  Ecchymosis both forearms  Psychiatric: He has a normal mood and affect. His behavior is normal. Thought content normal.   LABORATORY PANEL:   CBC  Recent Labs Lab 05/09/15 2331  WBC 5.5  HGB 12.2*  HCT 35.9*  PLT 43*   ------------------------------------------------------------------------------------------------------------------  Chemistries   Recent Labs Lab 05/09/15 2331  NA 133*  K 3.8  CL 97*  CO2 25  GLUCOSE 84  BUN 10  CREATININE 0.39*  CALCIUM 8.9  AST 72*  ALT 30  ALKPHOS 101  BILITOT 1.4*   ------------------------------------------------------------------------------------------------------------------  Cardiac Enzymes  Recent Labs Lab 05/09/15 2331  TROPONINI <0.03   ------------------------------------------------------------------------------------------------------------------  RADIOLOGY:  Ct Abdomen Pelvis W Contrast  05/10/2015   CLINICAL DATA:  Blood in stools for 2 days.  Abdominal pain.  EXAM: CT ABDOMEN AND PELVIS WITH CONTRAST  TECHNIQUE: Multidetector CT  imaging of the abdomen and pelvis was performed using the standard protocol following bolus administration of intravenous contrast.  CONTRAST:  OMNIPAQUE IOHEXOL 300 MG/ML  SOLN  COMPARISON:  02/07/2013  FINDINGS: Lung bases are clear. Previous pleural effusions and focal opacities are resolved. Small esophageal hiatal hernia. Hair esophageal varices are present.  Diffuse fatty infiltration of the liver. Hepatic cirrhosis with enlarged lateral segment left and caudate lobes and nodular contour. No focal liver  lesions. Cholelithiasis and gallbladder sludge with mild gallbladder wall thickening. This could be due to cirrhosis or cholecystitis. Varices demonstrated around the stomach and splenic artery. Calcified granulomas in the spleen. Spleen is not enlarged. Small accessory spleen. Pancreas and adrenal glands, kidneys, abdominal aorta, inferior vena cava, and retroperitoneal lymph nodes are unremarkable. Stomach, small bowel, and colon are not abnormally distended. Contrast material flows through to the colon without evidence of small bowel obstruction. Suggestion of wall thickening in the right colon which may indicate enteritis. No free air or free fluid in the abdomen.  Pelvis: Appendix is normal. Bladder wall is not thickened. Prostate gland is not enlarged. No free or loculated pelvic fluid collections. No pelvic mass or lymphadenopathy. Degenerative changes in the spine and hips, greater on the left hip. No destructive bone lesions.  IMPRESSION: Hepatic cirrhosis and fatty infiltration. Diffuse upper abdominal and paraesophageal varices. Wall thickening in the right colon may indicate colitis. No bowel obstruction. Cholelithiasis and gallbladder sludge with mild gallbladder wall thickening.   Electronically Signed   By: Burman Nieves M.D.   On: 05/10/2015 03:41    EKG:  No orders found for this or any previous visit.normal sinus rhythm with ventricular rate of 79 bpm  IMPRESSION AND PLAN:   1. Melena, likely secondary to variceal bleeding. Patient has history of cirrhosis  Secondary to alcohol usage. Hemodynamically stable. H&H mild low but stable. Plan: Admit to medicine, nothing by mouth except meds, IV Protonix, monitor serial CBC, GI consultation placed for further evaluation. 2. Abdominal pain, diffuse. CT of abdomen significant for cholelithiasis with mild gallbladder wall thickening suggestive of cholecystitis. Right side colonic wall thickening suggestive of possible colitis. Plan start IV  antibiotics-Cipro and Flagyl. Surgical consultation to address cholelithiasis placed. 3. Alcohol usage. No withdrawals at this time. We will place on CI WA protocol. 4. 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 GI consultation for further advice. 5. Thrombocytopenia, chronic,  Likely related to chronic alcohol usage.  Monitor platelets closely. 6. COPD stable on home medications. Continue same.    All the records are reviewed and case discussed with ED provider. Management plans discussed with the patient, family and they are in agreement.  CODE STATUS: full code  TOTAL TIME TAKING CARE OF THIS PATIENT: 50 minutes.    Crissie Figures M.D on 05/10/2015 at 4:59 AM  Between 7am to 6pm - Pager - [phone]  After 6pm go to [email or web address] - password [email or web address] [facility]  [place] [place] [facility]  Office  [phone]  CC: Primary care physician; Thomes Dinning, MD

## 2015-05-10 NOTE — Progress Notes (Signed)
[facility] [place] [facility]   PATIENT NAME: Albert HockeyJames Fowler    MR#:  [id number]  DATE OF BIRTH:  09/10/1961  SUBJECTIVE:  Patient is here with melena. Patient continues to drink. His last increase 3 days ago. Patient also complaining of abdominal pain located in the epigastric region.  REVIEW OF SYSTEMS:    Review of Systems  Constitutional: Negative for fever and chills.  Eyes: Negative for double vision.  Respiratory: Negative for cough and hemoptysis.   Gastrointestinal: Positive for melena. Negative for vomiting, abdominal pain, diarrhea and constipation.  Musculoskeletal: Negative for myalgias and neck pain.  Neurological: Positive for tremors. Negative for dizziness and headaches.    Tolerating Diet: Nothing by mouth    DRUG ALLERGIES:  No Known Allergies  VITALS:  Blood pressure 128/68, pulse 75, temperature 98.9 F (37.2 C), temperature source Oral, resp. rate 18, height 5\' 2"  (1.575 m), weight 54.432 kg (120 lb), SpO2 95 %.  PHYSICAL EXAMINATION:   Physical Exam  Constitutional: He is oriented to person, place, and time. No distress.  HENT:  Head: Normocephalic and atraumatic.  Eyes: EOM are normal.  Neck: Neck supple. No tracheal deviation present.  Cardiovascular: Normal rate, regular rhythm and normal heart sounds.   Pulmonary/Chest: Effort normal and breath sounds normal. No respiratory distress.  Abdominal: Soft. Bowel sounds are normal. He exhibits no distension and no mass. There is no tenderness. There is no rebound and no guarding.  Musculoskeletal: He exhibits no edema or tenderness.  Neurological: He is alert and oriented to person, place, and time. No cranial nerve deficit. Coordination normal.  Skin: Skin is warm and dry. No erythema.  Psychiatric: Affect normal.      LABORATORY PANEL:   CBC  Recent Labs Lab 05/10/15 0658  WBC 5.1  HGB 11.8*  HCT 34.7*  PLT 45*    ------------------------------------------------------------------------------------------------------------------  Chemistries   Recent Labs Lab 05/10/15 0658  NA 135  K 4.1  CL 99*  CO2 25  GLUCOSE 88  BUN 10  CREATININE 0.43*  CALCIUM 8.4*  AST 75*  ALT 28  ALKPHOS 93  BILITOT 1.6*   ------------------------------------------------------------------------------------------------------------------  Cardiac Enzymes  Recent Labs Lab 05/09/15 2331  TROPONINI <0.03   ------------------------------------------------------------------------------------------------------------------  RADIOLOGY:  Ct Abdomen Pelvis W Contrast  05/10/2015    IMPRESSION: Hepatic cirrhosis and fatty infiltration. Diffuse upper abdominal and paraesophageal varices. Wall thickening in the right colon may indicate colitis. No bowel obstruction. Cholelithiasis and gallbladder sludge with mild gallbladder wall thickening.   Electronically Signed   By: Burman NievesWilliam  Stevens M.D.   On: 05/10/2015 03:41     ASSESSMENT AND PLAN:   54 year old male with alcohol dependence who presented with abdominal pain and melena.  1. Upper GI bleed: I would be concerned that patient may have esophageal varices. Therefore I have started octreotide drip. We will continue with Protonix. GI has been consultative. Patient will need an EGD. Patient is currently nothing by mouth. I started clear liquid diets and he will be nothing by mouth after midnight. We'll continue to monitor his hemoglobin.  2. Colitis: Patient had CT scan which had evidence of colitis. I will continue ciprofloxacin and Flagyl. GI has also been consultative as per the admitting physician. I will follow-up on surgical recommendations.  3. Alcohol dependence: I have written for CIWA protocol. Patient's last drink was 3 days ago. I suspect he might go through DTs. He currently has tremors.      All the  records are reviewed and case discussed with Care  Management/Social Worker. Management plans discussed with the patient and he is in agreement.  CODE STATUS: FULL  TOTAL TIME TAKING CARE OF THIS PATIENT: 31 minutes.   POSSIBLE D/C IN 2-3 DAYS, DEPENDING ON CLINICAL CONDITION.   Krisha Beegle M.D on 05/10/2015 at 11:35 AM  Between 7am to 6pm - Pager - [phone] After 6pm go to [email or web address] - password [email or web address] [facility]  [facility] [place] [facility]  Office  [phone]  CC: Primary care physician; Thomes DinningWEEKS,CYNTHIA, MD

## 2015-05-10 NOTE — Progress Notes (Addendum)
ANTIBIOTIC CONSULT NOTE - INITIAL  Pharmacy Consult for Cipro Indication: cholelithiasis/colitis/SBP prophylaxis  No Known Allergies  Patient Measurements: Height: 5\' 2"  (157.5 cm) Weight: 120 lb (54.432 kg) IBW/kg (Calculated) : 54.6   Vital Signs: Temp: 98.7 F (37.1 C) (05/15 0606) Temp Source: Oral (05/14 2338) BP: 134/83 mmHg (05/15 0606) Pulse Rate: 84 (05/15 0606) Intake/Output from previous day:   Intake/Output from this shift:    Labs:  Recent Labs  05/09/15 2331  WBC 5.5  HGB 12.2*  PLT 43*  CREATININE 0.39*   Estimated Creatinine Clearance: 83.1 mL/min (by C-G formula based on Cr of 0.39). No results for input(s): VANCOTROUGH, VANCOPEAK, VANCORANDOM, GENTTROUGH, GENTPEAK, GENTRANDOM, TOBRATROUGH, TOBRAPEAK, TOBRARND, AMIKACINPEAK, AMIKACINTROU, AMIKACIN in the last 72 hours.   Microbiology: No results found for this or any previous visit (from the past 720 hour(s)).  Medical History: Past Medical History  Diagnosis Date  . COPD (chronic obstructive pulmonary disease)   . Asthma   . Cirrhosis   . Pneumothorax   . Left knee pain   . Right hip pain     Medications:  Scheduled:  . ciprofloxacin  400 mg Intravenous Q12H  . ipratropium-albuterol  3 mL Nebulization Q4H  . metronidazole  500 mg Intravenous Q8H  . montelukast  10 mg Oral QHS  . sertraline  50 mg Oral Daily  . tiotropium  18 mcg Inhalation Daily   Assessment: Patient with cholelithiasis, colitis, a h/o cirrhosis with esophageal varices per CT ordered empiric abx. Patient is ordered metronidazole 500 mg iv q 8 h.  Plan:  Ciprofloxacin 400 mg iv q 12 h.   Luisa HartChristy, Samarie Pinder D 05/10/2015,6:44 AM

## 2015-05-11 ENCOUNTER — Inpatient Hospital Stay: Payer: Medicare Other | Admitting: Anesthesiology

## 2015-05-11 ENCOUNTER — Encounter
Admission: EM | Disposition: A | Discharge status: Home / Self Care | Payer: Self-pay | Source: Home / Self Care | Attending: Internal Medicine

## 2015-05-11 ENCOUNTER — Encounter: Payer: Self-pay | Admitting: *Deleted

## 2015-05-11 HISTORY — PX: ESOPHAGOGASTRODUODENOSCOPY: SHX5428

## 2015-05-11 SURGERY — EGD (ESOPHAGOGASTRODUODENOSCOPY)
Anesthesia: General | Laterality: Left

## 2015-05-11 MED ORDER — SODIUM CHLORIDE 0.9 % IV SOLN
INTRAVENOUS | Status: DC
  Administered 2015-05-11: 06:00:00 via INTRAVENOUS

## 2015-05-11 MED ORDER — ALUM & MAG HYDROXIDE-SIMETH 200-200-20 MG/5ML PO SUSP
30.0000 mL | ORAL | Status: DC | PRN
  Administered 2015-05-11 (×2): 30 mL via ORAL
  Filled 2015-05-11 (×2): qty 30

## 2015-05-11 MED ORDER — MIDAZOLAM HCL 2 MG/2ML IJ SOLN
INTRAMUSCULAR | Status: DC | PRN
  Administered 2015-05-11: 1 mg via INTRAVENOUS

## 2015-05-11 MED ORDER — PROPOFOL INFUSION 10 MG/ML OPTIME
INTRAVENOUS | Status: DC | PRN
  Administered 2015-05-11: 140 ug/kg/min via INTRAVENOUS

## 2015-05-11 MED ORDER — FENTANYL CITRATE (PF) 100 MCG/2ML IJ SOLN
INTRAMUSCULAR | Status: DC | PRN
  Administered 2015-05-11: 50 ug via INTRAVENOUS

## 2015-05-11 MED ORDER — IPRATROPIUM-ALBUTEROL 0.5-2.5 (3) MG/3ML IN SOLN
3.0000 mL | Freq: Two times a day (BID) | RESPIRATORY_TRACT | Status: DC
  Administered 2015-05-11 – 2015-05-12 (×3): 3 mL via RESPIRATORY_TRACT
  Filled 2015-05-11 (×2): qty 3

## 2015-05-11 MED ORDER — ENSURE ENLIVE PO LIQD
237.0000 mL | Freq: Two times a day (BID) | ORAL | Status: DC
  Administered 2015-05-12: 237 mL via ORAL

## 2015-05-11 MED ORDER — PROPOFOL 10 MG/ML IV BOLUS
INTRAVENOUS | Status: DC | PRN
  Administered 2015-05-11: 20 mg via INTRAVENOUS
  Administered 2015-05-11: 50 mg via INTRAVENOUS
  Administered 2015-05-11: 20 mg via INTRAVENOUS

## 2015-05-11 NOTE — Progress Notes (Signed)
[facility] [place] at [facility]   PATIENT NAME: Albert HockeyJames Fowler    MR#:  [id number]  DATE OF BIRTH:  10/17/1961  SUBJECTIVE:  Patient denies melanotic stool. Still has tremors from his detox REVIEW OF SYSTEMS:    Review of Systems  Constitutional: Positive for malaise/fatigue. Negative for fever and chills.  Respiratory: Negative for cough.   Cardiovascular: Negative for chest pain.  Gastrointestinal: Negative for nausea, vomiting, abdominal pain and melena.  Skin: Negative for rash.  Neurological: Positive for tremors and headaches. Negative for dizziness.    Tolerating Diet: Clear liquids yes    DRUG ALLERGIES:  No Known Allergies  VITALS:  Blood pressure 133/75, pulse 92, temperature 99.5 F (37.5 C), temperature source Tympanic, resp. rate 18, height 5\' 2"  (1.575 m), weight 51.12 kg (112 lb 11.2 oz), SpO2 98 %.  PHYSICAL EXAMINATION:   Physical Exam  Constitutional: He is oriented to person, place, and time. No distress.  Thin frail   HENT:  Head: Normocephalic and atraumatic.  Eyes: EOM are normal.  Neck: Neck supple. No tracheal deviation present.  Cardiovascular: Normal rate, regular rhythm and normal heart sounds.   Pulmonary/Chest: Effort normal and breath sounds normal. No respiratory distress. He has no wheezes. He has no rales.  Abdominal: Soft. Bowel sounds are normal. He exhibits no distension and no mass. There is no tenderness. There is no rebound and no guarding.  Musculoskeletal: He exhibits no edema or tenderness.  Neurological: He is alert and oriented to person, place, and time. No cranial nerve deficit. Coordination normal.  No asterixis + tremors   Skin: Skin is warm and dry. No erythema.  Psychiatric: Affect normal.      LABORATORY PANEL:   CBC  Recent Labs Lab 05/10/15 2231  WBC 4.2  HGB 11.3*  HCT 33.5*  PLT 47*    ------------------------------------------------------------------------------------------------------------------  Chemistries   Recent Labs Lab 05/10/15 0658  NA 135  K 4.1  CL 99*  CO2 25  GLUCOSE 88  BUN 10  CREATININE 0.43*  CALCIUM 8.4*  AST 75*  ALT 28  ALKPHOS 93  BILITOT 1.6*   ------------------------------------------------------------------------------------------------------------------  Cardiac Enzymes  Recent Labs Lab 05/09/15 2331  TROPONINI <0.03   ------------------------------------------------------------------------------------------------------------------  RADIOLOGY:  Ct Abdomen Pelvis W Contrast  05/10/2015    IMPRESSION: Hepatic cirrhosis and fatty infiltration. Diffuse upper abdominal and paraesophageal varices. Wall thickening in the right colon may indicate colitis. No bowel obstruction. Cholelithiasis and gallbladder sludge with mild gallbladder wall thickening.   Electronically Signed   By: Burman NievesWilliam  Stevens M.D.   On: 05/10/2015 03:41     ASSESSMENT AND PLAN:   54 year old male with alcohol dependence who presented with abdominal pain and melena.  1. Upper GI bleed: Due to concern of esophageal varices, patient is currently on octreotide drip as well as Protonix drip. Plan is for EGD this morning. His hemoglobin remained stable. No need for transfusion at this time. Further recommendations after EGD is performed. I appreciate GI consultation.   2. Colitis: Patient had CT scan which had evidence of colitis. I will continue ciprofloxacin and Flagyl. surgical services has been consult that. There are no recommendations for surgery at this time. It is doubtful that patient has acute cholecystitis.   3. Alcohol dependence: patient will will remain on CIWA protocol. He continues to have tremors. I suspect he may go through DTs given his strong history alcohol dependence .      Management plans discussed with the  patient and he is in  agreement.  CODE STATUS: FULL  TOTAL TIME TAKING CARE OF THIS PATIENT: 25 minutes.   POSSIBLE D/C IN 2-3 DAYS, DEPENDING ON CLINICAL CONDITION.   Kishon Garriga M.D on 05/11/2015 at 11:09 AM  Between 7am to 6pm - Pager - [phone] After 6pm go to [email or web address] - password [email or web address] [facility]  [place] [facility] [place] [facility]  Office  [phone]  CC: Primary care physician; Thomes DinningWEEKS,CYNTHIA, MD

## 2015-05-11 NOTE — Anesthesia Procedure Notes (Signed)
Procedure Name: MAC Performed by: Derinda LateIACONE, Brytani Voth Pre-anesthesia Checklist: Patient identified, Emergency Drugs available, Suction available, Patient being monitored and Timeout performed Oxygen Delivery Method: Nasal cannula Intubation Type: IV induction

## 2015-05-11 NOTE — Anesthesia Postprocedure Evaluation (Signed)
  Anesthesia Post-op Note  Patient: Albert GivensJames H Fowler  Procedure(s) Performed: Procedure(s): Place tube in throat and evaluate for source of bleeding. If prominent esophageal varices present, then consider esophageal banding. (Left)  Anesthesia type:General  Patient location: PACU  Post pain: Pain level controlled  Post assessment: Post-op Vital signs reviewed, Patient's Cardiovascular Status Stable, Respiratory Function Stable, Patent Airway and No signs of Nausea or vomiting  Post vital signs: Reviewed and stable  Last Vitals:  Filed Vitals:   05/11/15 1220  BP: 152/82  Pulse: 103  Temp: 36.8 C  Resp: 17    Level of consciousness: awake, alert  and patient cooperative  Complications: No apparent anesthesia complications

## 2015-05-11 NOTE — Progress Notes (Signed)
Initial Nutrition Assessment  DOCUMENTATION CODES:  Non-severe (moderate) malnutrition in context of social or environmental circumstances  INTERVENTION: Meals and Snacks: Cater to patient preferences Medical Food Supplement Therapy: will recommend Ensure Enlive po BID, each supplement provides 350 kcal and 20 grams of protein  NUTRITION DIAGNOSIS:  Inadequate oral intake related to social / environmental circumstances as evidenced by per patient/family report.  GOAL:  Patient will meet greater than or equal to 90% of their needs  MONITOR:  PO intake, Supplement acceptance, Weight trends, I & O's, Labs  REASON FOR ASSESSMENT:  Malnutrition Screening Tool    ASSESSMENT:  Pt admitted with GI bleed, likely variceal bleed per MD note. Pt s/p EGD this am. Per MD note pt also with colitis.  PMHx:  Past Medical History  Diagnosis Date  . COPD (chronic obstructive pulmonary disease)   . Asthma   . Cirrhosis   . Pneumothorax   . Left knee pain   . Right hip pain     PO Intake: Pt NPO this am. On visit pt reports after wife's passing February 9th pt started drinking and eating less. Pt reports usually eating sandwiches and chips most days; pt somewhat vague about actual intake. Pt reports 'sometimes' eating breakfast.  Medications: Cipro, Folate, Flagyl, MVI, thiamine Labs: Electrolyte and Renal Profile:    Recent Labs Lab 05/09/15 2331 05/10/15 0658  BUN 10 10  CREATININE 0.39* 0.43*  NA 133* 135  K 3.8 4.1   Glucose Profile: No results for input(s): GLUCAP in the last 72 hours.  Hepatic Function Latest Ref Rng 05/10/2015 05/09/2015 04/08/2013  Total Protein 6.5 - 8.1 g/dL 7.3 7.8 7.0  Albumin 3.5 - 5.0 g/dL 3.7 4.1 3.4  AST 15 - 41 U/L 75(H) 72(H) 31  ALT 17 - 63 U/L 28 30 19   Alk Phosphatase 38 - 126 U/L 93 101 105  Total Bilirubin 0.3 - 1.2 mg/dL 1.6(H) 1.4(H) -     Pt reports weight of 150lbs February before wife's passing. (25% weight loss in 3.5  months)  Height:  Ht Readings from Last 1 Encounters:  05/09/15 5' 2"  (1.575 m)    Weight:  Wt Readings from Last 1 Encounters:  05/11/15 112 lb 11.2 oz (51.12 kg)    Wt Readings from Last 10 Encounters:  05/11/15 112 lb 11.2 oz (51.12 kg)    Nutrition-Focused physical exam completed. Findings are normal/mild fat depletion, mild/moderate muscle depletion, and no edema.    BMI:  Body mass index is 20.61 kg/(m^2).  Estimated Nutritional Needs:  Kcal:  0160-1093ATFTD, BEE: 1233kcals, TEE: (IF 1.1-1.3)(AF 1.3)  Protein:  56-66g protein (1.1-1.3g/kg)  Fluid:  1273-1538m of fluid (25-370mkg)  Diet Order:  Diet full liquid Room service appropriate?: Yes; Fluid consistency:: Thin  EDUCATION NEEDS:  No education needs identified at this time   Intake/Output Summary (Last 24 hours) at 05/11/15 1609 Last data filed at 05/11/15 1603  Gross per 24 hour  Intake    731 ml  Output    650 ml  Net     81 ml    Last BM:   Patient Vitals for the past 24 hrs:  Stool Color  05/10/15 2130 Green   HIGH Care Level  AlDwyane LuoRDNew HampshireLDN Pager [phone]

## 2015-05-11 NOTE — Care Management Note (Signed)
Case Management Note  Patient Details  Name: Albert Fowler MRN: [id number] Date of Birth: 03/27/1961  Subjective/Objective:                   Spoke with the patient who is alert and oriented about discharge plan. Patient stated that he lives at home with his Mother in [place] and that his spouse recently passed away. He stated that he has a walker but that he doesn't use it. Patient does not drive self but relies on his adult daughter. He [facility]. Patient stated that he can afford his medications. He has never had home health prior and has no preference. Spoke with the daughter Healther after interviewing the patient and she confirms that patient is able to ambulate and that she helps him with transportation. Continue to follow patient and adjust discharge planning as need be.   Action/Plan:    Expected Discharge Date:                  Expected Discharge Plan:  Home w Home Health Services  In-House Referral:     Discharge planning Services  CM Consult  Post Acute Care Choice:    Choice offered to:  Patient  DME Arranged:    DME Agency:  [facility]  HH Arranged:    HH Agency:  [facility]  Status of Service:     Medicare Important Message Given:  Yes Date Medicare IM Given:  05/11/15 Medicare IM give by:  Albert Fowler NCM Date Additional Medicare IM Given:    Additional Medicare Important Message give by:     If discussed at Long Length of Stay Meetings, dates discussed:    Additional Comments:  Albert Fowler, Albert Cieslewicz L, RN 05/11/2015, 2:00 PM

## 2015-05-11 NOTE — Anesthesia Postprocedure Evaluation (Incomplete)
  Anesthesia Post-op Note  Patient: Albert GivensJames H Siess  Procedure(s) Performed: Procedure(s): Place tube in throat and evaluate for source of bleeding. If prominent esophageal varices present, then consider esophageal banding. (Left)  Anesthesia type:General  Patient location: PACU  Post pain: Pain level controlled  Post assessment: Post-op Vital signs reviewed, Patient's Cardiovascular Status Stable, Respiratory Function Stable, Patent Airway and No signs of Nausea or vomiting  Post vital signs: Reviewed and stable  Last Vitals:  Filed Vitals:   05/11/15 1219  BP:   Pulse:   Temp: 36.8 C  Resp:     Level of consciousness: awake, alert  and patient cooperative  Complications: No apparent anesthesia complications

## 2015-05-11 NOTE — Consult Note (Signed)
  EGD showed Grade 2 esophageal varices and moderate portal gastropathy. 4 bands placed. Full liquid diet rest of today. If no further bleeding, stop octreotide drip tomorrow and start either nadolol or inderal PO to keep portal pressures down. Can advance to soft diet tomorrow if no bleeding today. Will need repeat EGD in 2 months or so. thanks

## 2015-05-11 NOTE — Anesthesia Preprocedure Evaluation (Signed)
Anesthesia Evaluation  Patient identified by MRN, date of birth, ID band Patient awake    Reviewed: Allergy & Precautions, NPO status , Patient's Chart, lab work & pertinent test results  Airway Mallampati: II  TM Distance: >3 FB Neck ROM: Full    Dental  (+) Edentulous Upper, Edentulous Lower   Pulmonary asthma , COPD (hx of pneumothorax) COPD inhaler, Current Smoker (1 1/2 ppd),          Cardiovascular     Neuro/Psych    GI/Hepatic (+) Cirrhosis -    substance abuse  alcohol use,   Endo/Other    Renal/GU      Musculoskeletal   Abdominal   Peds  Hematology   Anesthesia Other Findings   Reproductive/Obstetrics                             Anesthesia Physical Anesthesia Plan  ASA: III  Anesthesia Plan: General   Post-op Pain Management:    Induction: Intravenous  Airway Management Planned: Nasal Cannula  Additional Equipment:   Intra-op Plan:   Post-operative Plan:   Informed Consent: I have reviewed the patients History and Physical, chart, labs and discussed the procedure including the risks, benefits and alternatives for the proposed anesthesia with the patient or authorized representative who has indicated his/her understanding and acceptance.     Plan Discussed with:   Anesthesia Plan Comments:         Anesthesia Quick Evaluation

## 2015-05-11 NOTE — Transfer of Care (Signed)
Immediate Anesthesia Transfer of Care Note  Patient: Albert GivensJames H Fowler  Procedure(s) Performed: Procedure(s): Place tube in throat and evaluate for source of bleeding. If prominent esophageal varices present, then consider esophageal banding. (Left)  Patient Location: PACU and Endoscopy Unit  Anesthesia Type:General  Level of Consciousness: awake, alert , oriented and sedated  Airway & Oxygen Therapy: Patient Spontanous Breathing and Patient connected to nasal cannula oxygen  Post-op Assessment: Report given to RN, Post -op Vital signs reviewed and stable and Patient moving all extremities  Post vital signs: stable  Last Vitals:  Filed Vitals:   05/11/15 1219  BP:   Pulse:   Temp: 36.8 C  Resp:     Complications: No apparent anesthesia complications

## 2015-05-11 NOTE — Op Note (Signed)
[facility] Gastroenterology Patient Name: Roxan HockeyJames Hellums Procedure Date: 05/11/2015 11:51 AM MRN: [id number] Account #: [id number] Date of Birth: 08/01/1961 Admit Type: Inpatient Age: 54 [facility] Gender: Male Note Status: Finalized Procedure:         Upper GI endoscopy Indications:       Melena, Hx of cirrhosis. Providers:         Ezzard StandingPaul Y. Bluford Kaufmannh, MD Referring MD:      Duke Salviaynthia B. Weeks, MD (Referring MD) Medicines:         Monitored Anesthesia Care Complications:     No immediate complications. Procedure:         Pre-Anesthesia Assessment:                    - Prior to the procedure, a History and Physical was                     performed, and patient medications, allergies and                     sensitivities were reviewed. The patient's tolerance of                     previous anesthesia was reviewed.                    - The risks and benefits of the procedure and the sedation                     options and risks were discussed with the patient. All                     questions were answered and informed consent was obtained.                    - After reviewing the risks and benefits, the patient was                     deemed in satisfactory condition to undergo the procedure.                    After obtaining informed consent, the endoscope was passed                     under direct vision. Throughout the procedure, the                     patient's blood pressure, pulse, and oxygen saturations                     were monitored continuously. The Endoscope was introduced                     through the mouth, and advanced to the second part of                     duodenum. The upper GI endoscopy was accomplished without                     difficulty. The patient tolerated the procedure well. Findings:      Grade II varices were found in the lower third of the esophagus. They       were medium in largest diameter. Four bands were successfully  placed  with complete eradication, resulting in deflation of varices. There was       no bleeding at the end of the procedure.      The exam was otherwise without abnormality.      Moderate portal hypertensive gastropathy was found in the gastric fundus.      The exam was otherwise without abnormality.      The examined duodenum was normal. Impression:        - Grade II esophageal varices. Completely eradicated.                     Banded.                    - The examination was otherwise normal.                    - Portal hypertensive gastropathy.                    - The examination was otherwise normal.                    - Normal examined duodenum.                    - No specimens collected. Recommendation:    - Observe patient's clinical course.                    - Repeat the upper endoscopy in 2 months for retreatment.                    - The findings and recommendations were discussed with the                     patient.                    - Start inderal or nadolol. Procedure Code(s): --- Professional ---                    [phone], Esophagogastroduodenoscopy, flexible, transoral;                     with band ligation of esophageal/gastric varices Diagnosis Code(s): --- Professional ---                    I85.00, Esophageal varices without bleeding                    K76.6, Portal hypertension                    K31.89, Other diseases of stomach and duodenum                    K92.1, Melena CPT copyright 2014 American Medical Association. All rights reserved. The codes documented in this report are preliminary and upon coder review may  be revised to meet current compliance requirements. Wallace CullensPaul Y Braxden Lovering, MD 05/11/2015 12:13:35 PM This report has been signed electronically. Number of Addenda: 0 Note Initiated On: 05/11/2015 11:51 AM      [facility]

## 2015-05-11 NOTE — Plan of Care (Signed)
Problem: Phase I Progression Outcomes Goal: Other Phase I Outcomes/Goals Outcome: Not Applicable Date Met:  92/76/39 No additional Phase Outcome/Goals identified at this time.

## 2015-05-11 NOTE — H&P (Signed)
  Date of Initial H&P:05/10/2015  History reviewed, patient examined, no change in status, stable for surgery. 

## 2015-05-12 ENCOUNTER — Encounter: Payer: Self-pay | Admitting: Gastroenterology

## 2015-05-12 DIAGNOSIS — E440 Moderate protein-calorie malnutrition: Secondary | ICD-10-CM | POA: Insufficient documentation

## 2015-05-12 LAB — CBC
HEMATOCRIT: 34.1 % — AB (ref 40.0–52.0)
Hemoglobin: 11.8 g/dL — ABNORMAL LOW (ref 13.0–18.0)
MCH: 38.4 pg — ABNORMAL HIGH (ref 26.0–34.0)
MCHC: 34.6 g/dL (ref 32.0–36.0)
MCV: 111 fL — AB (ref 80.0–100.0)
Platelets: 66 10*3/uL — ABNORMAL LOW (ref 150–440)
RBC: 3.07 MIL/uL — ABNORMAL LOW (ref 4.40–5.90)
RDW: 13.9 % (ref 11.5–14.5)
WBC: 5.7 10*3/uL (ref 3.8–10.6)

## 2015-05-12 LAB — MAGNESIUM: Magnesium: 1.5 mg/dL — ABNORMAL LOW (ref 1.7–2.4)

## 2015-05-12 LAB — BASIC METABOLIC PANEL
Anion gap: 8 (ref 5–15)
BUN: 6 mg/dL (ref 6–20)
CHLORIDE: 97 mmol/L — AB (ref 101–111)
CO2: 26 mmol/L (ref 22–32)
Calcium: 8.3 mg/dL — ABNORMAL LOW (ref 8.9–10.3)
Creatinine, Ser: 0.46 mg/dL — ABNORMAL LOW (ref 0.61–1.24)
GFR calc non Af Amer: >60 mL/min (ref >60)
Glucose, Bld: 118 mg/dL — ABNORMAL HIGH (ref 65–99)
Potassium: 3.4 mmol/L — ABNORMAL LOW (ref 3.5–5.1)
SODIUM: 131 mmol/L — AB (ref 135–145)

## 2015-05-12 MED ORDER — CIPROFLOXACIN HCL 500 MG PO TABS: 500.0000 mg | ORAL_TABLET | Freq: Two times a day (BID) | ORAL | Status: DC

## 2015-05-12 MED ORDER — PROPRANOLOL HCL 10 MG PO TABS: 10.0000 mg | ORAL_TABLET | Freq: Three times a day (TID) | ORAL | Status: DC

## 2015-05-12 MED ORDER — PANTOPRAZOLE SODIUM 40 MG PO TBEC: 40.0000 mg | DELAYED_RELEASE_TABLET | Freq: Every day | ORAL | Status: DC

## 2015-05-12 MED ORDER — METRONIDAZOLE 500 MG PO TABS: 500.0000 mg | ORAL_TABLET | Freq: Three times a day (TID) | ORAL | Status: DC

## 2015-05-12 MED ORDER — PROPRANOLOL HCL 10 MG PO TABS
10.0000 mg | ORAL_TABLET | Freq: Three times a day (TID) | ORAL | Status: DC
  Administered 2015-05-12: 10 mg via ORAL
  Filled 2015-05-12: qty 1

## 2015-05-12 NOTE — Discharge Summary (Signed)
[facility] [place] [facility]   PATIENT NAME: Albert Fowler    MR#:  [id number]  DATE OF BIRTH:  03/23/1961  DATE OF ADMISSION:  05/09/2015 ADMITTING PHYSICIAN: Crissie FiguresEdavally N Reddy, MD  DATE OF DISCHARGE: 05/12/2015    PRIMARY CARE PHYSICIAN: WEEKS,CYNTHIA, MD    ADMISSION DIAGNOSIS:  Acute upper GI bleed [K92.2]  DISCHARGE DIAGNOSIS:  Principal Problem:   Melena Active Problems:   Abdominal pain   Alcohol use   Cirrhosis of liver   Thrombocytopenia   COPD (chronic obstructive pulmonary disease)   Malnutrition of moderate degree   SECONDARY DIAGNOSIS:   Past Medical History  Diagnosis Date  . COPD (chronic obstructive pulmonary disease)   . Asthma   . Cirrhosis   . Pneumothorax   . Left knee pain   . Right hip pain     HOSPITAL COURSE:  This is a 54 year old male with a history of alcohol abuse who presented with dark-colored stools/melena. For further details please refer the H&P.  1.upper GI bleed: Patient was admitted to the hospital service. His hemoglobin was monitored closely. He did not require blood transfusions during his hospital physician. GI consultation was obtained. Patient underwent EGD on 05/11/2015. He had 4 esophageal varices that were banded. I spoke with Dr. Val Eagle this morning who recommended the patient stay on a soft diet for 4-5 days. Patient may be discharged home. Patient will start propanolol.   2. Alcohol dependence. Patient is encouraged to stop drinking. Patient was placed on the CIWA protocol. Patient says he wants to stop drinking. Case management was consultative regarding this.  3. Colitis: Patient had a CT scan which had evidence of colitis on admission. Patient was placed on broad-spectrum antibiotics including ciprofloxacin and Flagyl. Surgery consultation was obtained. No surgical indications at this time. Patient will continue on these medications for total 14 days. There is no evidence of acute  cholecystitis.   DISCHARGE CONDITIONS AND DIET:  Soft diet for 4-5 days Patient stable for discharge home  CONSULTS OBTAINED:  Treatment Team:  Lattie Hawichard E Cooper, MD Wallace CullensPaul Y Oh, MD  DRUG ALLERGIES:  No Known Allergies  DISCHARGE MEDICATIONS:   Current Discharge Medication List    START taking these medications   Details  ciprofloxacin (CIPRO) 500 MG tablet Take 1 tablet (500 mg total) by mouth 2 (two) times daily. Qty: 14 tablet, Refills: 0    metroNIDAZOLE (FLAGYL) 500 MG tablet Take 1 tablet (500 mg total) by mouth 3 (three) times daily. Qty: 21 tablet, Refills: 0    propranolol (INDERAL) 10 MG tablet Take 1 tablet (10 mg total) by mouth 3 (three) times daily. Qty: 90 tablet, Refills: 0      CONTINUE these medications which have CHANGED   Details  pantoprazole (PROTONIX) 40 MG tablet Take 1 tablet (40 mg total) by mouth daily. Qty: 30 tablet, Refills: 0      CONTINUE these medications which have NOT CHANGED   Details  !! albuterol (PROVENTIL HFA;VENTOLIN HFA) 108 (90 BASE) MCG/ACT inhaler Inhale 2 puffs into the lungs 2 (two) times daily.    !! albuterol (PROVENTIL HFA;VENTOLIN HFA) 108 (90 BASE) MCG/ACT inhaler Inhale 2 puffs into the lungs every 4 (four) hours as needed for wheezing or shortness of breath.    HYDROcodone-acetaminophen (NORCO/VICODIN) 5-325 MG per tablet Take 1-2 tablets by mouth every 4 (four) hours as needed for moderate pain.     hydrOXYzine (ATARAX/VISTARIL) 25 MG tablet Take 25 mg by mouth  every 6 (six) hours as needed for itching.    montelukast (SINGULAIR) 10 MG tablet Take 10 mg by mouth at bedtime.    sertraline (ZOLOFT) 50 MG tablet Take 50 mg by mouth daily.    tiotropium (SPIRIVA) 18 MCG inhalation capsule Place 18 mcg into inhaler and inhale daily.     !! - Potential duplicate medications found. Please discuss with provider.            Today   CHIEF COMPLAINT:  Patient denies melena. Patient continues to have a tremor.  Patient says he will stop drinking.   VITAL SIGNS:  Blood pressure 151/77, pulse 87, temperature 98.5 F (36.9 C), temperature source Oral, resp. rate 16, height 5\' 2"  (1.575 m), weight 49.76 kg (109 lb 11.2 oz), SpO2 96 %.   REVIEW OF SYSTEMS:  Review of Systems  Constitutional: Negative for fever and chills.  Respiratory: Negative for hemoptysis and shortness of breath.   Cardiovascular: Negative for chest pain and palpitations.  Gastrointestinal: Negative for abdominal pain, diarrhea, constipation, blood in stool and melena.  Genitourinary: Negative for dysuria, urgency and frequency.  All other systems reviewed and are negative.    PHYSICAL EXAMINATION:  GENERAL:  54 y.o.-year-old frail appearing patient lying in the bed with no acute distress.  NECK:  Supple, no jugular venous distention. No thyroid enlargement, no tenderness.  LUNGS: Normal breath sounds bilaterally, no wheezing, rales,rhonchi  No use of accessory muscles of respiration.  CARDIOVASCULAR: S1, S2 normal. No murmurs, rubs, or gallops.  ABDOMEN: Soft, non-tender, non-distended. Bowel sounds present. No organomegaly or mass.  EXTREMITIES: No pedal edema, cyanosis, or clubbing.  PSYCHIATRIC: The patient is alert and oriented x 3.  SKIN: No obvious rash, lesion, or ulcer.   DATA REVIEW:   CBC  Recent Labs Lab 05/12/15 0436  WBC 5.7  HGB 11.8*  HCT 34.1*  PLT 66*    Chemistries   Recent Labs Lab 05/10/15 0658 05/12/15 0436  NA 135 131*  K 4.1 3.4*  CL 99* 97*  CO2 25 26  GLUCOSE 88 118*  BUN 10 6  CREATININE 0.43* 0.46*  CALCIUM 8.4* 8.3*  MG  --  1.5*  AST 75*  --   ALT 28  --   ALKPHOS 93  --   BILITOT 1.6*  --     Cardiac Enzymes  Recent Labs Lab 05/09/15 2331  TROPONINI <0.03    Microbiology Results  @MICRORSLT48 @  RADIOLOGY:  No results found.    Management plans discussed with the patient and he is in agreement. Stable for discharge home  Patient should follow up  with  Dr. Bluford Kaufmannh  CODE STATUS:     Code Status Orders        Start     Ordered   05/10/15 0625  Full code   Continuous     05/10/15 0624      TOTAL TIME TAKING CARE OF THIS PATIENT: 35 minutes.    Rebacca Votaw M.D on 05/12/2015 at 1:02 PM  Between 7am to 6pm - Pager - [phone] After 6pm go to [email or web address] - password [email or web address] [facility]  [facility]  Office  [phone]  CC: Primary care physician; Thomes DinningWEEKS,CYNTHIA, MD

## 2015-05-12 NOTE — Discharge Instructions (Signed)
Notify MD for any signs of bleeding, vomiting up blood or blood in your stools.  Take all your medications as prescribed.  Be sure to keep your follow-up appointments.   Bloody Stools Bloody stools means there is blood in your poop (stool). It is a sign that there is a problem somewhere in the digestive system. It is important for your doctor to find the cause of your bleeding, so the problem can be treated.  HOME CARE  Only take medicine as told by your doctor.  Eat foods with fiber (prunes, bran cereals).  Drink enough fluids to keep your pee (urine) clear or pale yellow.  Sit in warm water (sitz bath) for 10 to 15 minutes as told by your doctor.  Know how to take your medicines (enemas, suppositories) if advised by your doctor.  Watch for signs that you are getting better or getting worse. GET HELP RIGHT AWAY IF:   You are not getting better.  You start to get better but then get worse again.  You have new problems.  You have severe bleeding from the place where poop comes out (rectum) that does not stop.  You throw up (vomit) blood.  You feel weak or pass out (faint).  You have a fever. MAKE SURE YOU:   Understand these instructions.  Will watch your condition.  Will get help right away if you are not doing well or get worse. Document Released: 11/30/2009 Document Revised: 03/05/2012 Document Reviewed: 04/29/2011 [facility] Patient Information 2015 [place]. This information is not intended to replace advice given to you by your health care provider. Make sure you discuss any questions you have with your health care provider.

## 2015-05-12 NOTE — Progress Notes (Signed)
Pt to be discharged per MD order. Instructions printed and reviewed with pt. All questions answered.  Prescriptions given to pt. IV removed. Awaiting ride

## 2015-05-12 NOTE — Plan of Care (Signed)
Problem: Discharge Progression Outcomes Goal: Other Discharge Outcomes/Goals C/o pain x1 tonight. ciwa scale. Requiring ativan x1 thus far. Tolerating diet. Using urinal. No active bleeding noted. High fall risk, alarm on at all times. Cont to monitor

## 2015-07-22 ENCOUNTER — Encounter: Payer: Self-pay | Admitting: *Deleted

## 2015-07-23 ENCOUNTER — Ambulatory Visit
Admission: RE | Admit: 2015-07-23 | Discharge: 2015-07-23 | Disposition: A | Discharge status: Home / Self Care | Payer: Medicare Other | Source: Ambulatory Visit | Attending: Gastroenterology | Admitting: Gastroenterology

## 2015-07-23 ENCOUNTER — Encounter: Admission: RE | Payer: Self-pay | Source: Ambulatory Visit

## 2015-07-23 ENCOUNTER — Encounter
Admission: RE | Disposition: A | Discharge status: Home / Self Care | Payer: Self-pay | Source: Ambulatory Visit | Attending: Gastroenterology

## 2015-07-23 ENCOUNTER — Ambulatory Visit: Admission: RE | Admit: 2015-07-23 | Payer: Medicare Other | Source: Ambulatory Visit | Admitting: Gastroenterology

## 2015-07-23 ENCOUNTER — Ambulatory Visit: Payer: Medicare Other | Admitting: Anesthesiology

## 2015-07-23 DIAGNOSIS — K3189 Other diseases of stomach and duodenum: Secondary | ICD-10-CM | POA: Insufficient documentation

## 2015-07-23 DIAGNOSIS — F17210 Nicotine dependence, cigarettes, uncomplicated: Secondary | ICD-10-CM | POA: Insufficient documentation

## 2015-07-23 DIAGNOSIS — K7460 Unspecified cirrhosis of liver: Secondary | ICD-10-CM | POA: Diagnosis not present

## 2015-07-23 DIAGNOSIS — J45909 Unspecified asthma, uncomplicated: Secondary | ICD-10-CM | POA: Diagnosis not present

## 2015-07-23 DIAGNOSIS — I8500 Esophageal varices without bleeding: Secondary | ICD-10-CM | POA: Diagnosis present

## 2015-07-23 DIAGNOSIS — K766 Portal hypertension: Secondary | ICD-10-CM | POA: Insufficient documentation

## 2015-07-23 DIAGNOSIS — J449 Chronic obstructive pulmonary disease, unspecified: Secondary | ICD-10-CM | POA: Insufficient documentation

## 2015-07-23 DIAGNOSIS — Z79899 Other long term (current) drug therapy: Secondary | ICD-10-CM | POA: Insufficient documentation

## 2015-07-23 HISTORY — PX: ESOPHAGEAL BANDING: SHX5518

## 2015-07-23 HISTORY — PX: ESOPHAGOGASTRODUODENOSCOPY (EGD) WITH PROPOFOL: SHX5813

## 2015-07-23 SURGERY — ESOPHAGOGASTRODUODENOSCOPY (EGD) WITH PROPOFOL
Anesthesia: General

## 2015-07-23 SURGERY — EGD (ESOPHAGOGASTRODUODENOSCOPY)
Anesthesia: General

## 2015-07-23 MED ORDER — MIDAZOLAM HCL 2 MG/2ML IJ SOLN
INTRAMUSCULAR | Status: DC | PRN
  Administered 2015-07-23: 1 mg via INTRAVENOUS

## 2015-07-23 MED ORDER — PROPOFOL 10 MG/ML IV BOLUS
INTRAVENOUS | Status: DC | PRN
  Administered 2015-07-23: 20 mg via INTRAVENOUS

## 2015-07-23 MED ORDER — SODIUM CHLORIDE 0.9 % IV SOLN: INTRAVENOUS | Status: DC

## 2015-07-23 MED ORDER — SODIUM CHLORIDE 0.9 % IV SOLN
INTRAVENOUS | Status: DC
  Administered 2015-07-23: 12:00:00 via INTRAVENOUS
  Administered 2015-07-23: 1000 mL via INTRAVENOUS

## 2015-07-23 NOTE — H&P (Signed)
Primary Care Physician:  Thomes Dinning, MD Primary Gastroenterologist:  Dr. Bluford Kaufmann  Pre-Procedure History & Physical: HPI:  Albert Fowler is a 54 y.o. male is here for an EGD  Past Medical History  Diagnosis Date  . COPD (chronic obstructive pulmonary disease)   . Asthma   . Cirrhosis   . Pneumothorax   . Left knee pain   . Right hip pain     Past Surgical History  Procedure Laterality Date  . No past surgeries    . Esophagogastroduodenoscopy Left 05/11/2015    Procedure: Place tube in throat and evaluate for source of bleeding. If prominent esophageal varices present, then consider esophageal banding.;  Surgeon: Wallace Cullens, MD;  Location: [facility] ENDOSCOPY;  Service: Endoscopy;  Laterality: Left;    Prior to Admission medications   Medication Sig Start Date End Date Taking? Authorizing Provider  albuterol (PROVENTIL HFA;VENTOLIN HFA) 108 (90 BASE) MCG/ACT inhaler Inhale 2 puffs into the lungs 2 (two) times daily.   Yes Historical Provider, MD  propranolol (INDERAL) 10 MG tablet Take 1 tablet (10 mg total) by mouth 3 (three) times daily. 05/12/15  Yes Sital Mody, MD  tiotropium (SPIRIVA) 18 MCG inhalation capsule Place 18 mcg into inhaler and inhale daily.   Yes Historical Provider, MD  albuterol (PROVENTIL HFA;VENTOLIN HFA) 108 (90 BASE) MCG/ACT inhaler Inhale 2 puffs into the lungs every 4 (four) hours as needed for wheezing or shortness of breath.    Historical Provider, MD  ciprofloxacin (CIPRO) 500 MG tablet Take 1 tablet (500 mg total) by mouth 2 (two) times daily. 05/12/15   Adrian Saran, MD  HYDROcodone-acetaminophen (NORCO/VICODIN) 5-325 MG per tablet Take 1-2 tablets by mouth every 4 (four) hours as needed for moderate pain.     Historical Provider, MD  hydrOXYzine (ATARAX/VISTARIL) 25 MG tablet Take 25 mg by mouth every 6 (six) hours as needed for itching.    Historical Provider, MD  metroNIDAZOLE (FLAGYL) 500 MG tablet Take 1 tablet (500 mg total) by mouth 3 (three) times daily.  05/12/15   Sital Mody, MD  montelukast (SINGULAIR) 10 MG tablet Take 10 mg by mouth at bedtime.    Historical Provider, MD  pantoprazole (PROTONIX) 40 MG tablet Take 1 tablet (40 mg total) by mouth daily. 05/12/15   Adrian Saran, MD  sertraline (ZOLOFT) 50 MG tablet Take 50 mg by mouth daily.    Historical Provider, MD    Allergies as of 07/22/2015  . (No Known Allergies)    Family History  Problem Relation Age of Onset  . COPD Mother   . Cancer Father   . Cancer Brother     History   Social History  . Marital Status: Married    Spouse Name: N/A  . Number of Children: N/A  . Years of Education: N/A   Occupational History  . Not on file.   Social History Main Topics  . Smoking status: Current Every Day Smoker  . Smokeless tobacco: Never Used  . Alcohol Use: Yes  . Drug Use: No  . Sexual Activity: Not on file   Other Topics Concern  . Not on file   Social History Narrative    Review of Systems: See HPI, otherwise negative ROS  Physical Exam: BP 109/60 mmHg  Pulse 74  Temp(Src) 98.4 F (36.9 C) (Tympanic)  Resp 17  Ht 5\' 6"  (1.676 m)  Wt 56.7 kg (125 lb)  BMI 20.19 kg/m2  SpO2 97% General:   Alert,  pleasant and cooperative in NAD Head:  Normocephalic and atraumatic. Neck:  Supple; no masses or thyromegaly. Lungs:  Clear throughout to auscultation.    Heart:  Regular rate and rhythm. Abdomen:  Soft, nontender and nondistended. Normal bowel sounds, without guarding, and without rebound.   Neurologic:  Alert and  oriented x4;  grossly normal neurologically.  Impression/Plan: Albert Fowler is here for an EGD with possible banding. to be performed for f/u of esophageal varices  Risks, benefits, limitations, and alternatives regarding EGD  have been reviewed with the patient.  Questions have been answered.  All parties agreeable.   Tashonna Descoteaux, Ezzard Standing, MD  07/23/2015, 11:14 AM

## 2015-07-23 NOTE — Anesthesia Postprocedure Evaluation (Signed)
  Anesthesia Post-op Note  Patient: Albert Fowler  Procedure(s) Performed: Procedure(s): ESOPHAGOGASTRODUODENOSCOPY (EGD) WITH PROPOFOL (N/A) ESOPHAGEAL BANDING (N/A)  Anesthesia type:General  Patient location: PACU  Post pain: Pain level controlled  Post assessment: Post-op Vital signs reviewed, Patient's Cardiovascular Status Stable, Respiratory Function Stable, Patent Airway and No signs of Nausea or vomiting  Post vital signs: Reviewed and stable  Last Vitals:  Filed Vitals:   07/23/15 1202  BP: 77/56  Pulse: 84  Temp: 35.8 C  Resp: 18    Level of consciousness: awake, alert  and patient cooperative  Complications: No apparent anesthesia complications

## 2015-07-23 NOTE — Op Note (Signed)
[facility] Gastroenterology Patient Name: Albert Fowler Procedure Date: 07/23/2015 11:47 AM MRN: [id number] Account #: [id number] Date of Birth: 03/18/1961 Admit Type: Outpatient Age: 54 Room: [facility] ENDO ROOM 4 Gender: Male Note Status: Finalized Procedure:         Upper GI endoscopy Indications:       F/U of esophageal varices, s/p esophageal banding Providers:         Ezzard Standing. Bluford Kaufmann, MD Referring MD:      Duke Salvia. Weeks, MD (Referring MD) Medicines:         Monitored Anesthesia Care Complications:     No immediate complications. Procedure:         Pre-Anesthesia Assessment:                    - Prior to the procedure, a History and Physical was                     performed, and patient medications, allergies and                     sensitivities were reviewed. The patient's tolerance of                     previous anesthesia was reviewed.                    - The risks and benefits of the procedure and the sedation                     options and risks were discussed with the patient. All                     questions were answered and informed consent was obtained.                    - After reviewing the risks and benefits, the patient was                     deemed in satisfactory condition to undergo the procedure.                    After obtaining informed consent, the endoscope was passed                     under direct vision. Throughout the procedure, the                     patient's blood pressure, pulse, and oxygen saturations                     were monitored continuously. The Olympus GIF-160 endoscope                     (S#. [id number]) was introduced through the mouth, and                     advanced to the second part of duodenum. The upper GI                     endoscopy was accomplished without difficulty. The patient                     tolerated the procedure well. Findings:      Grade I varices were found in  the lower third of the  esophagus. They       were diminutive in largest diameter. No need to place banding.      The exam was otherwise without abnormality.      Mild portal hypertensive gastropathy was found in the gastric body.      The exam was otherwise without abnormality.      The examined duodenum was normal. Impression:        - Grade I esophageal varices.                    - The examination was otherwise normal.                    - Portal hypertensive gastropathy.                    - The examination was otherwise normal.                    - Normal examined duodenum.                    - No specimens collected. Recommendation:    - Discharge patient to home.                    - Observe patient's clinical course. Procedure Code(s): --- Professional ---                    [phone], Esophagogastroduodenoscopy, flexible, transoral;                     diagnostic, including collection of specimen(s) by                     brushing or washing, when performed (separate procedure) Diagnosis Code(s): --- Professional ---                    I85.00, Esophageal varices without bleeding                    K76.6, Portal hypertension                    K31.89, Other diseases of stomach and duodenum CPT copyright 2014 American Medical Association. All rights reserved. The codes documented in this report are preliminary and upon coder review may  be revised to meet current compliance requirements. Wallace Cullens, MD 07/23/2015 12:01:10 PM This report has been signed electronically. Number of Addenda: 0 Note Initiated On: 07/23/2015 11:47 AM      [facility]

## 2015-07-23 NOTE — Anesthesia Preprocedure Evaluation (Signed)
Anesthesia Evaluation  Patient identified by MRN, date of birth, ID band Patient awake    Reviewed: Allergy & Precautions, H&P , NPO status , Patient's Chart, lab work & pertinent test results, reviewed documented beta blocker date and time   Airway Mallampati: II  TM Distance: >3 FB Neck ROM: full    Dental no notable dental hx. (+) Edentulous Upper, Edentulous Lower   Pulmonary neg pulmonary ROS, asthma , COPDCurrent Smoker,  breath sounds clear to auscultation  Pulmonary exam normal       Cardiovascular Exercise Tolerance: Good negative cardio ROS  Rhythm:regular Rate:Normal     Neuro/Psych negative neurological ROS  negative psych ROS   GI/Hepatic negative GI ROS, Neg liver ROS, PUD, (+)   Esophageal Varices    ,   Endo/Other  negative endocrine ROS  Renal/GU negative Renal ROS  negative genitourinary   Musculoskeletal   Abdominal   Peds  Hematology negative hematology ROS (+)   Anesthesia Other Findings   Reproductive/Obstetrics negative OB ROS                             Anesthesia Physical Anesthesia Plan  ASA: III  Anesthesia Plan: General   Post-op Pain Management:    Induction:   Airway Management Planned:   Additional Equipment:   Intra-op Plan:   Post-operative Plan:   Informed Consent: I have reviewed the patients History and Physical, chart, labs and discussed the procedure including the risks, benefits and alternatives for the proposed anesthesia with the patient or authorized representative who has indicated his/her understanding and acceptance.   Dental Advisory Given  Plan Discussed with: CRNA  Anesthesia Plan Comments:         Anesthesia Quick Evaluation

## 2015-07-23 NOTE — Transfer of Care (Signed)
Immediate Anesthesia Transfer of Care Note  Patient: Albert Fowler  Procedure(s) Performed: Procedure(s): ESOPHAGOGASTRODUODENOSCOPY (EGD) WITH PROPOFOL (N/A) ESOPHAGEAL BANDING (N/A)  Patient Location: PACU and Endoscopy Unit  Anesthesia Type:General  Level of Consciousness: sedated  Airway & Oxygen Therapy: Patient Spontanous Breathing and Patient connected to nasal cannula oxygen  Post-op Assessment: Report given to RN, Post -op Vital signs reviewed and stable and Patient moving all extremities  Post vital signs: stable  Last Vitals:  Filed Vitals:   07/23/15 1202  BP: 77/56  Pulse: 84  Temp: 35.8 C  Resp: 18    Complications: No apparent anesthesia complications

## 2015-07-24 ENCOUNTER — Encounter: Payer: Self-pay | Admitting: Gastroenterology

## 2015-08-06 ENCOUNTER — Other Ambulatory Visit: Payer: Self-pay | Admitting: Unknown Physician Specialty

## 2015-08-06 DIAGNOSIS — M1612 Unilateral primary osteoarthritis, left hip: Secondary | ICD-10-CM

## 2015-08-12 ENCOUNTER — Ambulatory Visit: Payer: Medicare Other | Attending: Unknown Physician Specialty

## 2015-08-19 ENCOUNTER — Ambulatory Visit
Admission: RE | Admit: 2015-08-19 | Discharge: 2015-08-19 | Disposition: A | Discharge status: Home / Self Care | Payer: Medicare Other | Source: Ambulatory Visit | Attending: Unknown Physician Specialty | Admitting: Unknown Physician Specialty

## 2015-08-19 DIAGNOSIS — M1612 Unilateral primary osteoarthritis, left hip: Secondary | ICD-10-CM | POA: Diagnosis not present

## 2015-08-19 DIAGNOSIS — M25452 Effusion, left hip: Secondary | ICD-10-CM | POA: Insufficient documentation

## 2015-08-19 DIAGNOSIS — M25461 Effusion, right knee: Secondary | ICD-10-CM | POA: Diagnosis not present

## 2015-11-02 ENCOUNTER — Emergency Department (HOSPITAL_COMMUNITY): Payer: Medicare Other

## 2015-11-02 ENCOUNTER — Encounter (HOSPITAL_COMMUNITY): Payer: Self-pay | Admitting: *Deleted

## 2015-11-02 ENCOUNTER — Inpatient Hospital Stay (HOSPITAL_COMMUNITY)
Admission: EM | Admit: 2015-11-02 | Discharge: 2015-11-05 | DRG: 208 | Disposition: A | Discharge status: Other Acute Inpatient Hospital | Payer: Medicare Other | Attending: Pulmonary Disease | Admitting: Pulmonary Disease

## 2015-11-02 DIAGNOSIS — R64 Cachexia: Secondary | ICD-10-CM | POA: Diagnosis not present

## 2015-11-02 DIAGNOSIS — J9383 Other pneumothorax: Secondary | ICD-10-CM | POA: Diagnosis not present

## 2015-11-02 DIAGNOSIS — Z6820 Body mass index (BMI) 20.0-20.9, adult: Secondary | ICD-10-CM

## 2015-11-02 DIAGNOSIS — Z87891 Personal history of nicotine dependence: Secondary | ICD-10-CM | POA: Diagnosis not present

## 2015-11-02 DIAGNOSIS — K7030 Alcoholic cirrhosis of liver without ascites: Secondary | ICD-10-CM

## 2015-11-02 DIAGNOSIS — Z7901 Long term (current) use of anticoagulants: Secondary | ICD-10-CM | POA: Diagnosis not present

## 2015-11-02 DIAGNOSIS — D696 Thrombocytopenia, unspecified: Secondary | ICD-10-CM

## 2015-11-02 DIAGNOSIS — I10 Essential (primary) hypertension: Secondary | ICD-10-CM | POA: Diagnosis not present

## 2015-11-02 DIAGNOSIS — Z930 Tracheostomy status: Secondary | ICD-10-CM

## 2015-11-02 DIAGNOSIS — Z9911 Dependence on respirator [ventilator] status: Secondary | ICD-10-CM | POA: Diagnosis not present

## 2015-11-02 DIAGNOSIS — E7220 Disorder of urea cycle metabolism, unspecified: Secondary | ICD-10-CM | POA: Diagnosis present

## 2015-11-02 DIAGNOSIS — Z7289 Other problems related to lifestyle: Secondary | ICD-10-CM

## 2015-11-02 DIAGNOSIS — K7031 Alcoholic cirrhosis of liver with ascites: Secondary | ICD-10-CM | POA: Diagnosis not present

## 2015-11-02 DIAGNOSIS — Z9689 Presence of other specified functional implants: Secondary | ICD-10-CM

## 2015-11-02 DIAGNOSIS — Z789 Other specified health status: Secondary | ICD-10-CM

## 2015-11-02 DIAGNOSIS — Z931 Gastrostomy status: Secondary | ICD-10-CM | POA: Diagnosis not present

## 2015-11-02 DIAGNOSIS — J9621 Acute and chronic respiratory failure with hypoxia: Secondary | ICD-10-CM | POA: Diagnosis present

## 2015-11-02 DIAGNOSIS — Z825 Family history of asthma and other chronic lower respiratory diseases: Secondary | ICD-10-CM

## 2015-11-02 DIAGNOSIS — Z86718 Personal history of other venous thrombosis and embolism: Secondary | ICD-10-CM

## 2015-11-02 DIAGNOSIS — E440 Moderate protein-calorie malnutrition: Secondary | ICD-10-CM

## 2015-11-02 DIAGNOSIS — J939 Pneumothorax, unspecified: Secondary | ICD-10-CM | POA: Diagnosis not present

## 2015-11-02 DIAGNOSIS — Z7401 Bed confinement status: Secondary | ICD-10-CM | POA: Diagnosis not present

## 2015-11-02 DIAGNOSIS — Y92238 Other place in hospital as the place of occurrence of the external cause: Secondary | ICD-10-CM | POA: Diagnosis not present

## 2015-11-02 DIAGNOSIS — Z9289 Personal history of other medical treatment: Secondary | ICD-10-CM

## 2015-11-02 DIAGNOSIS — J45909 Unspecified asthma, uncomplicated: Secondary | ICD-10-CM | POA: Diagnosis present

## 2015-11-02 DIAGNOSIS — K766 Portal hypertension: Secondary | ICD-10-CM | POA: Diagnosis present

## 2015-11-02 DIAGNOSIS — T85628A Displacement of other specified internal prosthetic devices, implants and grafts, initial encounter: Secondary | ICD-10-CM | POA: Diagnosis not present

## 2015-11-02 DIAGNOSIS — N179 Acute kidney failure, unspecified: Secondary | ICD-10-CM | POA: Diagnosis present

## 2015-11-02 DIAGNOSIS — J449 Chronic obstructive pulmonary disease, unspecified: Secondary | ICD-10-CM | POA: Diagnosis not present

## 2015-11-02 DIAGNOSIS — K921 Melena: Secondary | ICD-10-CM

## 2015-11-02 DIAGNOSIS — Z96642 Presence of left artificial hip joint: Secondary | ICD-10-CM | POA: Diagnosis not present

## 2015-11-02 LAB — CBC
HEMATOCRIT: 29.7 % — AB (ref 39.0–52.0)
Hemoglobin: 9 g/dL — ABNORMAL LOW (ref 13.0–17.0)
MCH: 29.5 pg (ref 26.0–34.0)
MCHC: 30.3 g/dL (ref 30.0–36.0)
MCV: 97.4 fL (ref 78.0–100.0)
PLATELETS: 125 10*3/uL — AB (ref 150–400)
RBC: 3.05 MIL/uL — ABNORMAL LOW (ref 4.22–5.81)
RDW: 17.2 % — AB (ref 11.5–15.5)
WBC: 14.8 10*3/uL — ABNORMAL HIGH (ref 4.0–10.5)

## 2015-11-02 LAB — PROTIME-INR
INR: 1.24 (ref 0.00–1.49)
Prothrombin Time: 15.8 seconds — ABNORMAL HIGH (ref 11.6–15.2)

## 2015-11-02 LAB — COMPREHENSIVE METABOLIC PANEL
ALBUMIN: 2.6 g/dL — AB (ref 3.5–5.0)
ALT: 17 U/L (ref 17–63)
AST: 27 U/L (ref 15–41)
Alkaline Phosphatase: 98 U/L (ref 38–126)
Anion gap: 8 (ref 5–15)
BILIRUBIN TOTAL: 0.9 mg/dL (ref 0.3–1.2)
BUN: 20 mg/dL (ref 6–20)
CHLORIDE: 93 mmol/L — AB (ref 101–111)
CO2: 33 mmol/L — ABNORMAL HIGH (ref 22–32)
Calcium: 8.9 mg/dL (ref 8.9–10.3)
Creatinine, Ser: 1.38 mg/dL — ABNORMAL HIGH (ref 0.61–1.24)
GFR calc Af Amer: >60 mL/min (ref >60)
GFR, EST NON AFRICAN AMERICAN: 57 mL/min — AB (ref >60)
GLUCOSE: 109 mg/dL — AB (ref 65–99)
POTASSIUM: 4.1 mmol/L (ref 3.5–5.1)
Sodium: 134 mmol/L — ABNORMAL LOW (ref 135–145)
TOTAL PROTEIN: 6.1 g/dL — AB (ref 6.5–8.1)

## 2015-11-02 LAB — URINALYSIS, ROUTINE W REFLEX MICROSCOPIC
Bilirubin Urine: NEGATIVE
Glucose, UA: NEGATIVE mg/dL
KETONES UR: NEGATIVE mg/dL
LEUKOCYTES UA: NEGATIVE
NITRITE: NEGATIVE
Protein, ur: NEGATIVE mg/dL
Specific Gravity, Urine: 1.008 (ref 1.005–1.030)
Urobilinogen, UA: 0.2 mg/dL (ref 0.0–1.0)
pH: 6.5 (ref 5.0–8.0)

## 2015-11-02 LAB — URINE MICROSCOPIC-ADD ON

## 2015-11-02 MED ORDER — LIDOCAINE HCL (PF) 1 % IJ SOLN
INTRAMUSCULAR | Status: AC
  Administered 2015-11-03: 10 mL
  Filled 2015-11-02: qty 10

## 2015-11-02 MED ORDER — MIDAZOLAM HCL 5 MG/5ML IJ SOLN
5.0000 mg | INTRAMUSCULAR | Status: DC | PRN
  Administered 2015-11-03: 2 mg via INTRAVENOUS

## 2015-11-02 MED ORDER — SODIUM CHLORIDE 0.9 % IV BOLUS (SEPSIS)
1000.0000 mL | Freq: Once | INTRAVENOUS | Status: AC
  Administered 2015-11-03: 1000 mL via INTRAVENOUS

## 2015-11-02 MED ORDER — SODIUM CHLORIDE 0.9 % IV SOLN
2.0000 mg/h | INTRAVENOUS | Status: DC
  Administered 2015-11-02: 2 mg/h via INTRAVENOUS
  Filled 2015-11-02: qty 5

## 2015-11-02 MED ORDER — MIDAZOLAM HCL 2 MG/2ML IJ SOLN
INTRAMUSCULAR | Status: AC
  Filled 2015-11-02: qty 2

## 2015-11-02 NOTE — ED Notes (Signed)
Pt. Arrived to ED in restraints from [facility] because pt. keeps trying to pull at medical equipment. This RN ordered pt. Safety mitts in attempt to not use restraints for pt.

## 2015-11-02 NOTE — Progress Notes (Signed)
Attempt abg per MD verbal order. Unsuccessful. Pt excessively wiggles and moves prior to and during needle stick. RN aware.

## 2015-11-02 NOTE — ED Notes (Addendum)
Wasted 200cc of IV Dilaudid with Johnny BridgeKristina Munnett RN.

## 2015-11-02 NOTE — ED Provider Notes (Signed)
CSN: [id number]     Arrival date & time 11/02/15  1912 History   First MD Initiated Contact with Patient 11/02/15 1928     Chief Complaint  Patient presents with  . Chest Injury     (Consider location/radiation/quality/duration/timing/severity/associated sxs/prior Treatment) HPI Comments: 54 y.o. Male with history of COPD, hip surgery in September with inability to wean from vent with subsequent trach  Presents from [facility] for a right sided pneumothorax.  The patient reportedly was found on imaging at the care facility to have a pneumothorax.  Patient has had recent pulmonary infection.  Per daughter patient has had chest tube in the past for COPD related reasons.     Past Medical History  Diagnosis Date  . COPD (chronic obstructive pulmonary disease) (HCC)   . Asthma   . Cirrhosis (HCC)   . Pneumothorax   . Left knee pain   . Right hip pain    Past Surgical History  Procedure Laterality Date  . No past surgeries    . Esophagogastroduodenoscopy Left 05/11/2015    Procedure: Place tube in throat and evaluate for source of bleeding. If prominent esophageal varices present, then consider esophageal banding.;  Surgeon: Wallace CullensPaul Y Oh, MD;  [facility];  Service: Endoscopy;  Laterality: Left;  . Esophagogastroduodenoscopy (egd) with propofol N/A 07/23/2015    Procedure: ESOPHAGOGASTRODUODENOSCOPY (EGD) WITH PROPOFOL;  Surgeon: Wallace CullensPaul Y Oh, MD;  Location[facility] ENDOSCOPY;  Service: Gastroenterology;  Laterality: N/A;  . Esophageal banding N/A 07/23/2015    Procedure: ESOPHAGEAL BANDING;  Surgeon: Wallace CullensPaul Y Oh, MD;  [facility] END[facility]COPY;  Service: Gastroenterology;  Laterality: N/A;   Family History  Problem Relation Age of Onset  . COPD Mother   . Cancer Father   . Cancer Brother    Social History  Substance Use Topics  . Smoking status: Current Every Day Smoker  . Smokeless tobacco: Never Used  . Alcohol Use: Yes    Review of Systems  Unable to perform ROS: Intubated       Allergies  Cefazolin and Nsaids  Home Medications   Prior to Admission medications   Medication Sig Start Date End Date Taking? Authorizing Provider  arformoterol (BROVANA) 15 MCG/2ML NEBU Take 15 mcg by nebulization 2 (two) times daily.   Yes Historical Provider, MD  chlorhexidine (PERIDEX) 0.12 % solution Use as directed 15 mLs in the mouth or throat every 6 (six) hours.   Yes Historical Provider, MD  enoxaparin (LOVENOX) 80 MG/0.8ML injection Inject 80 mg into the skin daily.   Yes Historical Provider, MD  famotidine (PEPCID) 20 MG tablet Take 20 mg by mouth daily.   Yes Historical Provider, MD  HYDROMORPHONE HCL IJ Inject 1 mg/hr as directed continuous.    Yes Historical Provider, MD  lactulose (CHRONULAC) 10 GM/15ML solution Place 20 g into feeding tube daily.   Yes Historical Provider, MD  LORazepam (ATIVAN) 2 MG tablet 2 mg by PEG Tube route every 6 (six) hours.   Yes Historical Provider, MD  montelukast (SINGULAIR) 10 MG tablet Take 10 mg by mouth at bedtime.   Yes Historical Provider, MD  nicotine (NICODERM CQ - DOSED IN MG/24 HOURS) 21 mg/24hr patch Place 21 mg onto the skin daily.   Yes Historical Provider, MD  nystatin (MYCOSTATIN/NYSTOP) 100000 UNIT/GM POWD Apply 1 g topically every 8 (eight) hours.   Yes Historical Provider, MD  Oxycodone HCl 10 MG TABS 10 mg by PEG Tube route daily as needed (pain).   Yes Historical  Provider, MD  predniSONE (DELTASONE) 20 MG tablet 20 mg by PEG Tube route daily with breakfast.   Yes Historical Provider, MD  QUEtiapine (SEROQUEL) 100 MG tablet 100 mg by PEG Tube route every 12 (twelve) hours.   Yes Historical Provider, MD  senna (SENOKOT) 8.6 MG TABS tablet 1 tablet by PEG Tube route at bedtime.   Yes Historical Provider, MD  sertraline (ZOLOFT) 50 MG tablet 150 mg by PEG Tube route daily.    Yes Historical Provider, MD  thiamine 100 MG tablet 100 mg by PEG Tube route daily.   Yes Historical Provider, MD  ciprofloxacin (CIPRO) 500 MG  tablet Take 1 tablet (500 mg total) by mouth 2 (two) times daily. Patient not taking: Reported on 11/02/2015 05/12/15   Adrian Saran, MD  metroNIDAZOLE (FLAGYL) 500 MG tablet Take 1 tablet (500 mg total) by mouth 3 (three) times daily. Patient not taking: Reported on 11/02/2015 05/12/15   Adrian Saran, MD  pantoprazole (PROTONIX) 40 MG tablet Take 1 tablet (40 mg total) by mouth daily. Patient not taking: Reported on 11/02/2015 05/12/15   Adrian Saran, MD  propranolol (INDERAL) 10 MG tablet Take 1 tablet (10 mg total) by mouth 3 (three) times daily. Patient not taking: Reported on 11/02/2015 05/12/15   Adrian Saran, MD   BP 175/101 mmHg  Pulse 109  Temp(Src) 98.8 F (37.1 C) (Temporal)  Resp 19  Ht  (1.753 m)  Wt 125 lb (56.7 kg)  BMI 18.45 kg/m2  SpO2 95% Physical Exam  Constitutional: No distress.  HENT:  Head: Normocephalic and atraumatic.  Eyes: Right eye exhibits no discharge. Left eye exhibits no discharge.  Neck: Normal range of motion. Neck supple.  Trach in place without erythema or drainage  Cardiovascular: Tachycardia present.   Pulmonary/Chest: He has decreased breath sounds in the right upper field, the right middle field and the right lower field.  Abdominal: Soft. He exhibits no distension and no mass.  Musculoskeletal: He exhibits no edema.  Neurological:  Not answering questions.  Following simple commands.  Skin: Skin is warm and dry. He is not diaphoretic.  Vitals reviewed.   ED Course  CHEST TUBE INSERTION Date/Time: 11/03/2015 4:13 AM Performed by: Tyrone Apple ROE Authorized by: Leta Baptist Consent: Verbal consent obtained. Risks and benefits: risks, benefits and alternatives were discussed Required items: required blood products, implants, devices, and special equipment available Patient identity confirmed: arm band Indications: pneumothorax Patient sedated: yes Sedation type: anxiolysis Anesthesia: local infiltration Local anesthetic: lidocaine 1%  without epinephrine Preparation: skin prepped with ChloraPrep Placement location: right lateral Scalpel size: 11 Tube size: 28 [place] Dissection instrument: finger Ultrasound guidance: no Tension pneumothorax heard: no Tube connected to: water seal Suture material: 2-0 silk Dressing: 4x4 sterile gauze Post-insertion x-ray findings: tube in good position Patient tolerance: Patient tolerated the procedure well with no immediate complications   (including critical care time) Labs Review Labs Reviewed  CBC - Abnormal; Notable for the following:    WBC 14.8 (*)    RBC 3.05 (*)    Hemoglobin 9.0 (*)    HCT 29.7 (*)    RDW 17.2 (*)    Platelets 125 (*)    All other components within normal limits  COMPREHENSIVE METABOLIC PANEL - Abnormal; Notable for the following:    Sodium 134 (*)    Chloride 93 (*)    CO2 33 (*)    Glucose, Bld 109 (*)    Creatinine, Ser 1.38 (*)  Total Protein 6.1 (*)    Albumin 2.6 (*)    GFR calc non Af Amer 57 (*)    All other components within normal limits  PROTIME-INR - Abnormal; Notable for the following:    Prothrombin Time 15.8 (*)    All other components within normal limits  URINALYSIS, ROUTINE W REFLEX MICROSCOPIC (NOT AT [facility]) - Abnormal; Notable for the following:    Hgb urine dipstick MODERATE (*)    All other components within normal limits  URINE MICROSCOPIC-ADD ON    Imaging Review Dg Chest Portable 1 View  11/02/2015  CLINICAL DATA:  Reported pneumothorax. EXAM: PORTABLE CHEST 1 VIEW COMPARISON:  Chest x-ray dated 02/22/2014. FINDINGS: There is a right pneumothorax, moderate to large in size. Heart and mediastinal structures remain in the midline. Endotracheal tube well positioned with tip above the level of the carina. Patchy opacities within each lung could be edema or chronic fibrosis. IMPRESSION: 1. Right-sided pneumothorax, moderate to large in size, with pleural line displacement of approximately 3 cm. 2. Endotracheal tube well  positioned with tip above the level of the carina. 3. Pulmonary edema versus chronic interstitial fibrosis. Critical Value/emergent results were called by telephone at the time of interpretation on 11/02/2015 at 8:23 pm to Dr. Tyrone Apple , who verbally acknowledged these results. Electronically Signed   By: Bary Richard M.D.   On: 11/02/2015 20:25   I have personally reviewed and evaluated these images and lab results as part of my medical decision-making.   EKG Interpretation None      MDM  Patient seen and evaluated at bedside.  Patient arrived on Dilaudid drip and on Vent.  These were continued.  Xray with right sided pneumothorax.  Discussed with CT surgeon Dr. Karl Ito who said that he would follow patient but that he could be admitted to medicine/critical care and that either I or critical care could place chest tube.  Discussed with daughter at bedside who agreed with procedure.  No signs of tension.  Chest tube placed as detailed above although adhesions noted on placement, minimal lung inflation after placement.  Discussed with critical care who saw patient bedside and admitted the patient under their care for further management. Final diagnoses:  None    1. Right sided pneumothorax    Leta Baptist, MD 11/03/15 [phone]

## 2015-11-02 NOTE — ED Notes (Signed)
Pt. Is from [facility], vent dependent, Trach pt. On a dilaudid drip. Pt. Has a history of pneumothorax.

## 2015-11-03 ENCOUNTER — Inpatient Hospital Stay (HOSPITAL_COMMUNITY): Payer: Medicare Other

## 2015-11-03 ENCOUNTER — Emergency Department (HOSPITAL_COMMUNITY): Payer: Medicare Other

## 2015-11-03 DIAGNOSIS — Y92238 Other place in hospital as the place of occurrence of the external cause: Secondary | ICD-10-CM | POA: Diagnosis not present

## 2015-11-03 DIAGNOSIS — K7031 Alcoholic cirrhosis of liver with ascites: Secondary | ICD-10-CM | POA: Diagnosis present

## 2015-11-03 DIAGNOSIS — E7220 Disorder of urea cycle metabolism, unspecified: Secondary | ICD-10-CM | POA: Diagnosis present

## 2015-11-03 DIAGNOSIS — Z86718 Personal history of other venous thrombosis and embolism: Secondary | ICD-10-CM | POA: Diagnosis not present

## 2015-11-03 DIAGNOSIS — Z96642 Presence of left artificial hip joint: Secondary | ICD-10-CM | POA: Diagnosis present

## 2015-11-03 DIAGNOSIS — J9383 Other pneumothorax: Secondary | ICD-10-CM | POA: Diagnosis present

## 2015-11-03 DIAGNOSIS — Z930 Tracheostomy status: Secondary | ICD-10-CM

## 2015-11-03 DIAGNOSIS — Z7401 Bed confinement status: Secondary | ICD-10-CM | POA: Diagnosis not present

## 2015-11-03 DIAGNOSIS — K7460 Unspecified cirrhosis of liver: Secondary | ICD-10-CM

## 2015-11-03 DIAGNOSIS — K766 Portal hypertension: Secondary | ICD-10-CM | POA: Diagnosis present

## 2015-11-03 DIAGNOSIS — K7030 Alcoholic cirrhosis of liver without ascites: Secondary | ICD-10-CM | POA: Diagnosis not present

## 2015-11-03 DIAGNOSIS — Z7901 Long term (current) use of anticoagulants: Secondary | ICD-10-CM | POA: Diagnosis not present

## 2015-11-03 DIAGNOSIS — J9620 Acute and chronic respiratory failure, unspecified whether with hypoxia or hypercapnia: Secondary | ICD-10-CM

## 2015-11-03 DIAGNOSIS — J9621 Acute and chronic respiratory failure with hypoxia: Secondary | ICD-10-CM | POA: Diagnosis present

## 2015-11-03 DIAGNOSIS — J939 Pneumothorax, unspecified: Secondary | ICD-10-CM | POA: Diagnosis present

## 2015-11-03 DIAGNOSIS — Z9911 Dependence on respirator [ventilator] status: Secondary | ICD-10-CM | POA: Diagnosis not present

## 2015-11-03 DIAGNOSIS — J449 Chronic obstructive pulmonary disease, unspecified: Secondary | ICD-10-CM | POA: Diagnosis present

## 2015-11-03 DIAGNOSIS — J9311 Primary spontaneous pneumothorax: Secondary | ICD-10-CM

## 2015-11-03 DIAGNOSIS — J432 Centrilobular emphysema: Secondary | ICD-10-CM | POA: Diagnosis not present

## 2015-11-03 DIAGNOSIS — J45909 Unspecified asthma, uncomplicated: Secondary | ICD-10-CM | POA: Diagnosis present

## 2015-11-03 DIAGNOSIS — R64 Cachexia: Secondary | ICD-10-CM | POA: Diagnosis present

## 2015-11-03 DIAGNOSIS — Z931 Gastrostomy status: Secondary | ICD-10-CM | POA: Diagnosis not present

## 2015-11-03 DIAGNOSIS — Z825 Family history of asthma and other chronic lower respiratory diseases: Secondary | ICD-10-CM | POA: Diagnosis not present

## 2015-11-03 DIAGNOSIS — T85628A Displacement of other specified internal prosthetic devices, implants and grafts, initial encounter: Secondary | ICD-10-CM | POA: Diagnosis not present

## 2015-11-03 DIAGNOSIS — Z87891 Personal history of nicotine dependence: Secondary | ICD-10-CM | POA: Diagnosis not present

## 2015-11-03 DIAGNOSIS — N179 Acute kidney failure, unspecified: Secondary | ICD-10-CM | POA: Diagnosis present

## 2015-11-03 DIAGNOSIS — Z6820 Body mass index (BMI) 20.0-20.9, adult: Secondary | ICD-10-CM | POA: Diagnosis not present

## 2015-11-03 DIAGNOSIS — I10 Essential (primary) hypertension: Secondary | ICD-10-CM | POA: Diagnosis present

## 2015-11-03 LAB — GLUCOSE, CAPILLARY
GLUCOSE-CAPILLARY: 118 mg/dL — AB (ref 65–99)
GLUCOSE-CAPILLARY: 84 mg/dL (ref 65–99)
Glucose-Capillary: 101 mg/dL — ABNORMAL HIGH (ref 65–99)

## 2015-11-03 LAB — BLOOD GAS, ARTERIAL
Acid-Base Excess: 5.8 mmol/L — ABNORMAL HIGH (ref 0.0–2.0)
BICARBONATE: 31.1 meq/L — AB (ref 20.0–24.0)
Drawn by: 398661
FIO2: 0.5
LHR: 20 {breaths}/min
MECHVT: 520 mL
O2 Saturation: 99.5 %
PATIENT TEMPERATURE: 98.6
PCO2 ART: 56.6 mmHg — AB (ref 35.0–45.0)
PEEP: 5 cmH2O
PO2 ART: 130 mmHg — AB (ref 80.0–100.0)
TCO2: 32.8 mmol/L (ref 0–100)
pH, Arterial: 7.358 (ref 7.350–7.450)

## 2015-11-03 LAB — AMMONIA: AMMONIA: 38 umol/L — AB (ref 9–35)

## 2015-11-03 LAB — MRSA PCR SCREENING: MRSA BY PCR: POSITIVE — AB

## 2015-11-03 LAB — MAGNESIUM: Magnesium: 1.3 mg/dL — ABNORMAL LOW (ref 1.7–2.4)

## 2015-11-03 LAB — CBC
HCT: 28.1 % — ABNORMAL LOW (ref 39.0–52.0)
Hemoglobin: 8.3 g/dL — ABNORMAL LOW (ref 13.0–17.0)
MCH: 29.3 pg (ref 26.0–34.0)
MCHC: 29.5 g/dL — AB (ref 30.0–36.0)
MCV: 99.3 fL (ref 78.0–100.0)
PLATELETS: 91 10*3/uL — AB (ref 150–400)
RBC: 2.83 MIL/uL — ABNORMAL LOW (ref 4.22–5.81)
RDW: 17.4 % — AB (ref 11.5–15.5)
WBC: 12 10*3/uL — AB (ref 4.0–10.5)

## 2015-11-03 LAB — BASIC METABOLIC PANEL
ANION GAP: 9 (ref 5–15)
BUN: 17 mg/dL (ref 6–20)
CALCIUM: 8.4 mg/dL — AB (ref 8.9–10.3)
CO2: 32 mmol/L (ref 22–32)
CREATININE: 1.29 mg/dL — AB (ref 0.61–1.24)
Chloride: 95 mmol/L — ABNORMAL LOW (ref 101–111)
GLUCOSE: 88 mg/dL (ref 65–99)
Potassium: 3.8 mmol/L (ref 3.5–5.1)
Sodium: 136 mmol/L (ref 135–145)

## 2015-11-03 LAB — PHOSPHORUS: PHOSPHORUS: 3.3 mg/dL (ref 2.5–4.6)

## 2015-11-03 LAB — PROCALCITONIN: PROCALCITONIN: 0.21 ng/mL

## 2015-11-03 MED ORDER — CHLORHEXIDINE GLUCONATE 0.12% ORAL RINSE (MEDLINE KIT)
15.0000 mL | Freq: Two times a day (BID) | OROMUCOSAL | Status: DC
  Administered 2015-11-03 – 2015-11-05 (×5): 15 mL via OROMUCOSAL

## 2015-11-03 MED ORDER — PROPOFOL 1000 MG/100ML IV EMUL
5.0000 ug/kg/min | INTRAVENOUS | Status: DC
  Administered 2015-11-03: 35 ug/kg/min via INTRAVENOUS
  Administered 2015-11-03: 20 ug/kg/min via INTRAVENOUS
  Administered 2015-11-04 (×2): 25 ug/kg/min via INTRAVENOUS
  Administered 2015-11-04: 35 ug/kg/min via INTRAVENOUS
  Administered 2015-11-05: 30 ug/kg/min via INTRAVENOUS
  Filled 2015-11-03 (×6): qty 100

## 2015-11-03 MED ORDER — PANTOPRAZOLE SODIUM 40 MG IV SOLR: 40.0000 mg | Freq: Every day | INTRAVENOUS | Status: DC

## 2015-11-03 MED ORDER — FAMOTIDINE 20 MG PO TABS
20.0000 mg | ORAL_TABLET | Freq: Every day | ORAL | Status: DC
  Administered 2015-11-03 – 2015-11-05 (×3): 20 mg
  Filled 2015-11-03 (×4): qty 1

## 2015-11-03 MED ORDER — SODIUM CHLORIDE 0.9 % IV SOLN: 250.0000 mL | INTRAVENOUS | Status: DC | PRN

## 2015-11-03 MED ORDER — LIDOCAINE HCL (PF) 1 % IJ SOLN
INTRAMUSCULAR | Status: AC
  Filled 2015-11-03: qty 5

## 2015-11-03 MED ORDER — LORAZEPAM 2 MG/ML IJ SOLN: 2.0000 mg | Freq: Four times a day (QID) | INTRAMUSCULAR | Status: DC | PRN

## 2015-11-03 MED ORDER — MIDAZOLAM HCL 2 MG/2ML IJ SOLN
2.0000 mg | INTRAMUSCULAR | Status: DC | PRN
  Administered 2015-11-03 (×2): 2 mg via INTRAVENOUS
  Filled 2015-11-03 (×2): qty 2

## 2015-11-03 MED ORDER — SERTRALINE HCL 50 MG PO TABS
150.0000 mg | ORAL_TABLET | Freq: Every day | ORAL | Status: DC
  Administered 2015-11-03 – 2015-11-05 (×3): 150 mg
  Filled 2015-11-03 (×5): qty 1

## 2015-11-03 MED ORDER — ANTISEPTIC ORAL RINSE SOLUTION (CORINZ)
7.0000 mL | OROMUCOSAL | Status: DC
  Administered 2015-11-03 – 2015-11-05 (×22): 7 mL via OROMUCOSAL

## 2015-11-03 MED ORDER — VITAL AF 1.2 CAL PO LIQD
1000.0000 mL | ORAL | Status: DC
  Administered 2015-11-03 – 2015-11-05 (×2): 1000 mL
  Filled 2015-11-03 (×5): qty 1000

## 2015-11-03 MED ORDER — FENTANYL BOLUS VIA INFUSION
50.0000 ug | INTRAVENOUS | Status: DC | PRN
  Filled 2015-11-03: qty 50

## 2015-11-03 MED ORDER — FOLIC ACID 1 MG PO TABS: 1.0000 mg | ORAL_TABLET | Freq: Every day | ORAL | Status: DC

## 2015-11-03 MED ORDER — FENTANYL CITRATE (PF) 2500 MCG/50ML IJ SOLN
25.0000 ug/h | INTRAMUSCULAR | Status: DC
  Administered 2015-11-03: 25 ug/h via INTRAVENOUS
  Filled 2015-11-03 (×2): qty 50

## 2015-11-03 MED ORDER — PREDNISONE 20 MG PO TABS
20.0000 mg | ORAL_TABLET | Freq: Every day | ORAL | Status: DC
  Administered 2015-11-03 – 2015-11-05 (×3): 20 mg
  Filled 2015-11-03 (×4): qty 1

## 2015-11-03 MED ORDER — FENTANYL CITRATE (PF) 100 MCG/2ML IJ SOLN
100.0000 ug | Freq: Once | INTRAMUSCULAR | Status: AC
  Administered 2015-11-03: 100 ug via INTRAVENOUS

## 2015-11-03 MED ORDER — FENTANYL CITRATE (PF) 100 MCG/2ML IJ SOLN
INTRAMUSCULAR | Status: AC
  Filled 2015-11-03: qty 2

## 2015-11-03 MED ORDER — MIDAZOLAM HCL 2 MG/2ML IJ SOLN
2.0000 mg | INTRAMUSCULAR | Status: DC | PRN
  Administered 2015-11-03 (×3): 2 mg via INTRAVENOUS
  Filled 2015-11-03 (×3): qty 2

## 2015-11-03 MED ORDER — MONTELUKAST SODIUM 10 MG PO TABS
10.0000 mg | ORAL_TABLET | Freq: Every day | ORAL | Status: DC
  Administered 2015-11-03 – 2015-11-04 (×3): 10 mg
  Filled 2015-11-03 (×4): qty 1

## 2015-11-03 MED ORDER — FENTANYL CITRATE (PF) 100 MCG/2ML IJ SOLN
50.0000 ug | Freq: Once | INTRAMUSCULAR | Status: DC
  Filled 2015-11-03: qty 2

## 2015-11-03 MED ORDER — VITAL HIGH PROTEIN PO LIQD: 1000.0000 mL | ORAL | Status: DC

## 2015-11-03 MED ORDER — LIDOCAINE HCL (PF) 1 % IJ SOLN
5.0000 mL | Freq: Once | INTRAMUSCULAR | Status: AC
  Administered 2015-11-03: 5 mL

## 2015-11-03 MED ORDER — VITAMIN B-1 100 MG PO TABS
100.0000 mg | ORAL_TABLET | Freq: Every day | ORAL | Status: DC
  Administered 2015-11-03 – 2015-11-05 (×3): 100 mg
  Filled 2015-11-03 (×4): qty 1

## 2015-11-03 MED ORDER — CHLORHEXIDINE GLUCONATE CLOTH 2 % EX PADS
6.0000 | MEDICATED_PAD | Freq: Every day | CUTANEOUS | Status: DC
  Administered 2015-11-03 – 2015-11-05 (×3): 6 via TOPICAL

## 2015-11-03 MED ORDER — QUETIAPINE FUMARATE 100 MG PO TABS
100.0000 mg | ORAL_TABLET | Freq: Two times a day (BID) | ORAL | Status: DC
  Administered 2015-11-03 – 2015-11-05 (×6): 100 mg
  Filled 2015-11-03 (×8): qty 1

## 2015-11-03 MED ORDER — FOLIC ACID 5 MG/ML IJ SOLN
1.0000 mg | Freq: Every day | INTRAMUSCULAR | Status: DC
  Administered 2015-11-03: 1 mg via INTRAVENOUS
  Filled 2015-11-03: qty 0.2

## 2015-11-03 MED ORDER — LACTULOSE 10 GM/15ML PO SOLN
20.0000 g | Freq: Every day | ORAL | Status: DC
  Administered 2015-11-03 – 2015-11-05 (×3): 20 g
  Filled 2015-11-03 (×3): qty 30

## 2015-11-03 MED ORDER — PHENYLEPHRINE HCL 10 MG/ML IJ SOLN
30.0000 ug/min | INTRAMUSCULAR | Status: DC
  Administered 2015-11-03 (×2): 20 ug/min via INTRAVENOUS
  Filled 2015-11-03 (×2): qty 1

## 2015-11-03 MED ORDER — FOLIC ACID 1 MG PO TABS
1.0000 mg | ORAL_TABLET | Freq: Every day | ORAL | Status: DC
  Administered 2015-11-04 – 2015-11-05 (×2): 1 mg
  Filled 2015-11-03 (×2): qty 1

## 2015-11-03 MED ORDER — MUPIROCIN 2 % EX OINT
1.0000 "application " | TOPICAL_OINTMENT | Freq: Two times a day (BID) | CUTANEOUS | Status: DC
  Administered 2015-11-03 – 2015-11-05 (×5): 1 via NASAL
  Filled 2015-11-03: qty 22

## 2015-11-03 MED ORDER — ARFORMOTEROL TARTRATE 15 MCG/2ML IN NEBU
15.0000 ug | INHALATION_SOLUTION | Freq: Two times a day (BID) | RESPIRATORY_TRACT | Status: DC
  Administered 2015-11-03 – 2015-11-05 (×5): 15 ug via RESPIRATORY_TRACT
  Filled 2015-11-03 (×8): qty 2

## 2015-11-03 MED ORDER — ENOXAPARIN SODIUM 80 MG/0.8ML ~~LOC~~ SOLN
80.0000 mg | SUBCUTANEOUS | Status: DC
  Administered 2015-11-03 – 2015-11-05 (×3): 80 mg via SUBCUTANEOUS
  Filled 2015-11-03 (×4): qty 0.8

## 2015-11-03 MED ORDER — SODIUM CHLORIDE 0.9 % IV SOLN
INTRAVENOUS | Status: DC
  Administered 2015-11-03: 17:00:00 via INTRAVENOUS
  Administered 2015-11-03: 75 mL/h via INTRAVENOUS
  Administered 2015-11-04: 20:00:00 via INTRAVENOUS
  Administered 2015-11-04: 75 mL/h via INTRAVENOUS

## 2015-11-03 NOTE — Progress Notes (Signed)
Pt. Was transported to CT & back to 2S13 without any complications around 15:30.

## 2015-11-03 NOTE — Care Management Note (Signed)
Case Management Note  Patient Details  Name: Albert GivensJames H Fowler MRN: [id number] Date of Birth: 12/24/1961  Subjective/Objective:       From [facility].  [facility] states can take back when ready at [facility] level.              Action/Plan:   Expected Discharge Date:                  Expected Discharge Plan:  Long Term Acute Care (LTAC)  In-House Referral:  Clinical Social Work  Discharge planning Services  CM Consult  Post Acute Care Choice:    Choice offered to:     DME Arranged:    DME Agency:     HH Arranged:    HH Agency:     Status of Service:  In process, will continue to follow  Medicare Important Message Given:    Date Medicare IM Given:    Medicare IM give by:    Date Additional Medicare IM Given:    Additional Medicare Important Message give by:     If discussed at Long Length of Stay Meetings, dates discussed:    Additional Comments:  [name], [name], RN 11/03/2015, 9:23 AM

## 2015-11-03 NOTE — Progress Notes (Signed)
Fentanyl 20ml flushed down sink. Witnessed by Lamar Sprinklesechen Dolma, RN

## 2015-11-03 NOTE — Consult Note (Signed)
[address]       [place] [address]             [phone]        Subjective:   Patient is a 54 y.o. male admitted from [facility] after a chest Xray revealed a right pneumothorax requiring chest tube placement. He has multiple medical co-morbidities including vent dependent resp failure, tracheostomy, cirrhosis with portal htn, h/o asp pneumonia and metabolic encephalopathy. Chest tube was placed by ER last pm. The initial chest tube showed incomplete re-expansion    Patient Active Problem List   Diagnosis Date Noted  . Pneumothorax 11/03/2015  . Malnutrition of moderate degree (HCC) 05/12/2015  . Melena 05/10/2015  . Abdominal pain 05/10/2015  . Alcohol use (HCC) 05/10/2015  . Cirrhosis of liver (HCC) 05/10/2015  . Thrombocytopenia (HCC) 05/10/2015  . COPD (chronic obstructive pulmonary disease) (HCC) 05/10/2015   Past Medical History  Diagnosis Date  . COPD (chronic obstructive pulmonary disease) (HCC)   . Asthma   . Cirrhosis (HCC)   . Pneumothorax   . Left knee pain   . Right hip pain     Past Surgical History  Procedure Laterality Date  . No past surgeries    . Esophagogastroduodenoscopy Left 05/11/2015    Procedure: Place tube in throat and evaluate for source of bleeding. If prominent esophageal varices present, then consider esophageal banding.;  Surgeon: Wallace Cullens, MD;  Location: [facility] ENDOSCOPY;  Service: Endoscopy;  Laterality: Left;  . Esophagogastroduodenoscopy (egd) with propofol N/A 07/23/2015    Procedure: ESOPHAGOGASTRODUODENOSCOPY (EGD) WITH PROPOFOL;  Surgeon: Wallace Cullens, MD;  Location: [facility] ENDOSCOPY;  Service: Gastroenterology;  Laterality: N/A;  . Esophageal banding N/A 07/23/2015    Procedure: ESOPHAGEAL BANDING;  Surgeon: Wallace Cullens, MD;  Location: [facility] ENDOSCOPY;  Service: Gastroenterology;  Laterality: N/A;    Prescriptions prior to admission  Medication Sig Dispense Refill Last Dose  . arformoterol (BROVANA) 15  MCG/2ML NEBU Take 15 mcg by nebulization 2 (two) times daily.   11/02/2015 at Unknown time  . chlorhexidine (PERIDEX) 0.12 % solution Use as directed 15 mLs in the mouth or throat every 6 (six) hours.   11/02/2015 at Unknown time  . enoxaparin (LOVENOX) 80 MG/0.8ML injection Inject 80 mg into the skin daily.   11/02/2015 at 517pm  . famotidine (PEPCID) 20 MG tablet Take 20 mg by mouth daily.   11/02/2015 at Unknown time  . HYDROMORPHONE HCL IJ Inject 1 mg/hr as directed continuous.    11/02/2015 at Unknown time  . lactulose (CHRONULAC) 10 GM/15ML solution Place 20 g into feeding tube daily.   11/02/2015 at Unknown time  . LORazepam (ATIVAN) 2 MG tablet 2 mg by PEG Tube route every 6 (six) hours.   11/02/2015 at Unknown time  . montelukast (SINGULAIR) 10 MG tablet Take 10 mg by mouth at bedtime.   11/01/2015 at Unknown time  . nicotine (NICODERM CQ - DOSED IN MG/24 HOURS) 21 mg/24hr patch Place 21 mg onto the skin daily.   11/02/2015 at Unknown time  . nystatin (MYCOSTATIN/NYSTOP) 100000 UNIT/GM POWD Apply 1 g topically every 8 (eight) hours.   11/02/2015 at Unknown time  . Oxycodone HCl 10 MG TABS 10 mg by PEG Tube route daily as needed (pain).   11/02/2015 at Unknown time  . predniSONE (DELTASONE) 20 MG tablet 20 mg by PEG Tube route daily with breakfast.   11/02/2015 at Unknown time  . QUEtiapine (SEROQUEL)  100 MG tablet 100 mg by PEG Tube route every 12 (twelve) hours.   11/02/2015 at Unknown time  . senna (SENOKOT) 8.6 MG TABS tablet 1 tablet by PEG Tube route at bedtime.   11/01/2015 at Unknown time  . sertraline (ZOLOFT) 50 MG tablet 150 mg by PEG Tube route daily.    11/02/2015 at Unknown time  . thiamine 100 MG tablet 100 mg by PEG Tube route daily.   11/02/2015 at Unknown time  . ciprofloxacin (CIPRO) 500 MG tablet Take 1 tablet (500 mg total) by mouth 2 (two) times daily. (Patient not taking: Reported on 11/02/2015) 14 tablet 0 Completed Course at Unknown time  . metroNIDAZOLE (FLAGYL) 500 MG tablet Take 1  tablet (500 mg total) by mouth 3 (three) times daily. (Patient not taking: Reported on 11/02/2015) 21 tablet 0 Completed Course at Unknown time  . pantoprazole (PROTONIX) 40 MG tablet Take 1 tablet (40 mg total) by mouth daily. (Patient not taking: Reported on 11/02/2015) 30 tablet 0 Not Taking at Unknown time  . propranolol (INDERAL) 10 MG tablet Take 1 tablet (10 mg total) by mouth 3 (three) times daily. (Patient not taking: Reported on 11/02/2015) 90 tablet 0 Not Taking at Unknown time   Allergies  Allergen Reactions  . Cefazolin Hives  . Nsaids Other (See Comments)    Gi bleeds    Social History  Substance Use Topics  . Smoking status: Current Every Day Smoker  . Smokeless tobacco: Never Used  . Alcohol Use: Yes    Family History  Problem Relation Age of Onset  . COPD Mother   . Cancer Father   . Cancer Brother     Review of Systems He is non-verbal and on vent  Objective:   Patient Vitals for the past 8 hrs:  BP Temp Temp src Pulse Resp SpO2  11/03/15 1200 (!) 84/48 mmHg - - 96 20 99 %  11/03/15 1140 131/71 mmHg - - 98 20 98 %  11/03/15 1100 (!) 164/71 mmHg 97 F (36.1 C) Axillary (!) 110 (!) 23 (!) 88 %  11/03/15 1000 (!) 167/79 mmHg - - 90 20 100 %  11/03/15 0915 - - - 96 (!) 25 (!) 84 %  11/03/15 0908 - - - - - (!) 83 %  11/03/15 0905 - - - 74 18 (!) 84 %  11/03/15 0900 126/66 mmHg - - (!) 124 20 100 %  11/03/15 0855 - - - - - 99 %  11/03/15 0850 - - - - - 92 %  11/03/15 0845 - - - - - (!) 83 %  11/03/15 0840 - - - - - (!) 82 %  11/03/15 0830 133/83 mmHg - - (!) 125 20 100 %  11/03/15 0825 - - - 72 13 92 %  11/03/15 0820 - - - - - (!) 80 %  11/03/15 0815 - - - 89 (!) 26 (!) 79 %  11/03/15 0810 - - - - - 100 %  11/03/15 0800 130/66 mmHg - - 96 20 97 %  11/03/15 0748 - 97.3 F (36.3 C) Axillary - - -  11/03/15 0741 (!) 106/59 mmHg - - 86 20 96 %  11/03/15 0737 - - - - - 100 %  11/03/15 0700 (!) 112/59 mmHg - - 93 (!) 23 98 %  11/03/15 0600 117/67 mmHg - - 86 18  97 %  11/03/15 0500 (!) 85/55 mmHg - - 88 20 100 %  11/03/15 0458 - - -  91 20 100 %   Physical Exam  HENT:  Head: Normocephalic and atraumatic.  Edentulous   Eyes: Conjunctivae are normal. Right eye exhibits no discharge. Left eye exhibits no discharge. No scleral icterus.  Neck:  + trach  Cardiovascular: Regular rhythm and normal heart sounds.  Exam reveals no friction rub.   No murmur heard. Pulmonary/Chest: No respiratory distress. He has no wheezes. He has no rales. He exhibits no tenderness.  Abdominal: He exhibits distension.  + ascites   Musculoskeletal: He exhibits no edema or tenderness.  Neurological:  Non verbal , doesn't follow commands   Skin: Skin is warm and dry.     Data Review:  CBC:  Lab Results  Component Value Date   WBC 12.0* 11/03/2015   WBC 9.7 02/22/2014   RBC 2.83* 11/03/2015   RBC 4.06* 02/22/2014   BMP:  Lab Results  Component Value Date   GLUCOSE 88 11/03/2015   GLUCOSE 87 02/22/2014   CO2 32 11/03/2015   CO2 26 02/22/2014   BUN 17 11/03/2015   BUN 8 02/22/2014   CREATININE 1.29* 11/03/2015   CREATININE 0.86 02/22/2014   CALCIUM 8.4* 11/03/2015   CALCIUM 8.3* 02/22/2014   Coagulation:  Lab Results  Component Value Date   INR 1.24 11/02/2015   Cardiac markers: No results found for: CKMB, TROPONINT, MYOGLOBIN ABGs: No results found for: [facility] Radiology review:  IMPRESSION: Interval placement of RIGHT chest tube, without significant decrease of RIGHT pneumothorax suggesting non compliant lung.  Similar probably fibrotic changes LEFT lung.  No apparent change an remaining life support lines.  Ct Chest Wo Contrast  11/03/2015  CLINICAL DATA:  Evaluate chest tube as patient not doing well on ventilator. EXAM: CT CHEST WITHOUT CONTRAST TECHNIQUE: Multidetector CT imaging of the chest was performed following the standard protocol without IV contrast. COMPARISON:  Chest x-ray 11/03/2015 and chest CT 03/03/2008 FINDINGS: Tracheostomy  tube is in adequate position. Left thigh IJ central venous catheter has tip over the left brachycephalic vein just proximal to the SVC. Right-sided chest tube courses from the lateral right chest wall posteriorly, superficial to the bony thorax and pleural cavity with tip within the paraspinal soft tissues at the approximate T5 level. There is evidence of patient's known moderate size right pneumothorax. Small bilateral pleural effusions are present. There is evidence of fibrotic change within the mid to lower lungs. There is mixed interstitial airspace opacification over the lingula and left lower lobe with suggestion of similar changes within the collapsed right lung. Mild nodularity within the left upper lobe. Findings may be due to an acute infectious versus inflammatory process. Heart is normal size. Calcification and possible stent over the left anterior descending coronary artery. Mild calcification of the thoracic aorta. No significant mediastinal, hilar or axillary adenopathy. Remaining mediastinal structures are within normal. Images through the upper abdomen demonstrate moderate ascites. Calcified splenic granulomas are present. Sludge versus small stones within the gallbladder. Evidence of cirrhosis with moderate nodularity to the liver contour. Percutaneous gastrostomy tube in adequate position. IMPRESSION: Known moderate size right pneumothorax. Note that the right-sided chest tube lies outside the right thoracic cavity posterior to the bony chest wall with tip within the paraspinal soft tissues at the level of T5. Small bilateral pleural effusions as well as mixed interstitial airspace process over the collapsed right lung as well as the lingula and left lower lobe with mild nodularity over the left upper lobe which may be due to an acute infectious or inflammatory process.  Background of mild fibrotic change. Cirrhosis and moderate ascites. Remaining tubes and lines as described. Atherosclerotic  coronary artery disease and possible stent involving the left anterior descending coronary artery. Electronically Signed   By: Elberta Fortis M.D.   On: 11/03/2015 16:03   Dg Chest Port 1 View  11/03/2015  ADDENDUM REPORT: 11/03/2015 17:49 ADDENDUM: Additional considerations for the diffuse interstitial opacities within the right lung and left lung base include acute infectious or inflammatory process or pulmonary edema. Electronically Signed   By: Annia Belt M.D.   On: 11/03/2015 17:49  11/03/2015  CLINICAL DATA:  Patient with history of pneumothorax status post chest tube removal and re- insertion. EXAM: PORTABLE CHEST 1 VIEW COMPARISON:  CT chest 11/03/2015; chest radiograph 11/03/2015 FINDINGS: Tracheostomy tube stable in position. Left IJ central venous catheter tip projects over the superior vena cava. Monitoring leads overlie the patient. Right chest tube is present with tip terminating near the right lung apex. Stable cardiac and mediastinal contours. No significant residual right-sided pneumothorax. Probable small right pleural effusion. Re- demonstrated coarse interstitial opacities throughout the right lung. Coarse interstitial opacities within the left lung base. IMPRESSION: New right chest tube with near complete resolution of previously described right pneumothorax. Re- demonstrated diffuse interstitial prominence throughout the right lung and left lung base likely representing fibrosis. Electronically Signed: By: Annia Belt M.D. On: 11/03/2015 17:31   Dg Chest Port 1 View  11/03/2015  CLINICAL DATA:  Chest tube in place. EXAM: PORTABLE CHEST 1 VIEW COMPARISON:  Chest x-rays from earlier same day and 11/02/2015. FINDINGS: The right-sided chest tube is stable in position the right-sided pneumothorax is unchanged in size again measuring approximately 3 cm pleural displacement from the chest wall. No mediastinal shift. The interstitial prominence is again noted within the collapsed lung and also  at the left lung base, unchanged. Tracheostomy tube remains well positioned with tip above the level of the carina. The left internal jugular central line remains adequately positioned with tip at the brachiocephalic confluence. IMPRESSION: Stable chest x-ray. Right-sided chest tube stable in position. Right-sided pneumothorax appears stable in size. Again noted is the diffuse interstitial prominence throughout the right lung and at the left lung base, presumably at least some component representing chronic interstitial fibrosis. No new lung findings. All tubes and lines stable in position. Electronically Signed   By: Bary Richard M.D.   On: 11/03/2015 15:09   Dg Chest Portable 1 View  11/03/2015  CLINICAL DATA:  Post chest tube insertion. History of COPD, cirrhosis. EXAM: PORTABLE CHEST 1 VIEW COMPARISON:  Chest radiograph November 02, 2015 FINDINGS: Interval placement of RIGHT apical chest tube with side port projecting within the chest wall. Stable appearance of RIGHT pneumothorax measuring up to 2.9 cm from the chest wall. No mediastinal shift. Interstitial prominence of the underlying collapsed lung. Interstitial prominence in the LEFT mid and lower lung zone. Tracheostomy tube tip projects at level of the clavicle heads. LEFT internal jugular central venous catheter projects at brachiocephalic confluence. IMPRESSION: Interval placement of RIGHT chest tube, without significant decrease of RIGHT pneumothorax suggesting non compliant lung. Similar probably fibrotic changes LEFT lung. No apparent change an remaining life support lines. Electronically Signed   By: Awilda Metro M.D.   On: 11/03/2015 00:45   Dg Chest Portable 1 View  11/02/2015  CLINICAL DATA:  Reported pneumothorax. EXAM: PORTABLE CHEST 1 VIEW COMPARISON:  Chest x-ray dated 02/22/2014. FINDINGS: There is a right pneumothorax, moderate to large in size. Heart and  mediastinal structures remain in the midline. Endotracheal tube well  positioned with tip above the level of the carina. Patchy opacities within each lung could be edema or chronic fibrosis. IMPRESSION: 1. Right-sided pneumothorax, moderate to large in size, with pleural line displacement of approximately 3 cm. 2. Endotracheal tube well positioned with tip above the level of the carina. 3. Pulmonary edema versus chronic interstitial fibrosis. Critical Value/emergent results were called by telephone at the time of interpretation on 11/02/2015 at 8:23 pm to Dr. Tyrone AppleEMILY NGUYEN , who verbally acknowledged these results. Electronically Signed   By: Bary RichardStan  Maynard M.D.   On: 11/02/2015 20:25     Assessment:   Right pneumothorax, with miss placed chest tube   Plan:    CT confirms  chest tube not in pleural space - Discussed with Dr Molli KnockYacoub  Who will place a new chest tube CCM service is managing acute and chronic medical issues Will follow chest tube with you

## 2015-11-03 NOTE — Progress Notes (Signed)
Pt transported/admitted to 2S13. transported on 100% FiO2. Report given to unit RT. Uneventful transport. RN at bedside.

## 2015-11-03 NOTE — H&P (Signed)
PULMONARY / CRITICAL CARE MEDICINE   Name: Albert Fowler MRN: [id number] DOB: 09-Jul-1961    ADMISSION DATE:  11/02/2015 CONSULTATION DATE:  11/03/15  REFERRING MD :  [facility]  CHIEF COMPLAINT:  Right pneumothorax  INITIAL PRESENTATION: 54 y.o. 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  Brought to [facility] 11/7 after CXR at [facility] revealed a R sided pneumothorax.  Chest tube inserted by ED provider and CCM called for admission.  STUDIES:   CXR (11/7):  Right-sided pneumothorax, moderate to large in size, with pleural line displacement of approximately 3 cm. Endotracheal tube well positioned with tip above the level of the carina. Pulmonary edema versus chronic interstitial fibrosis.  CXR (11/8): Interval placement of RIGHT chest tube, without significant decrease of RIGHT pneumothorax suggesting non compliant lung.  SIGNIFICANT EVENTS: 11/8: admitted   HISTORY OF PRESENT ILLNESS:   History is provided by patient's daughter.  She reports that patient underwent a total L hip surgery in September of this year.  He required intubation a few days after surgery for respiratory failure secondary to aspiration pneumonia.  He was unable to wean from vent and subsequently required trach placement.  He has had at least 2 other admissions to ICU since that time with pneumonia.  On 11/7, patient had a chest xray to follow up on his pneumonia and a right pneumothorax was found.  Patient was transferred from [facility] to [facility].  Patient has a h/o COPD, tobacco use, cirrhosis with varices, DVT of the upper extremity for which he take Lovenox.  PAST MEDICAL HISTORY :   has a past medical history of COPD (chronic obstructive pulmonary disease) (HCC); Asthma; Cirrhosis (HCC); Pneumothorax; Left knee pain; and Right hip pain.  has past surgical history that includes No past surgeries; Esophagogastroduodenoscopy (Left, 05/11/2015);  Esophagogastroduodenoscopy (egd) with propofol (N/A, 07/23/2015); and esophageal banding (N/A, 07/23/2015). Prior to Admission medications   Medication Sig Start Date End Date Taking? Authorizing Provider  arformoterol (BROVANA) 15 MCG/2ML NEBU Take 15 mcg by nebulization 2 (two) times daily.   Yes Historical Provider, MD  chlorhexidine (PERIDEX) 0.12 % solution Use as directed 15 mLs in the mouth or throat every 6 (six) hours.   Yes Historical Provider, MD  enoxaparin (LOVENOX) 80 MG/0.8ML injection Inject 80 mg into the skin daily.   Yes Historical Provider, MD  famotidine (PEPCID) 20 MG tablet Take 20 mg by mouth daily.   Yes Historical Provider, MD  HYDROMORPHONE HCL IJ Inject 1 mg/hr as directed continuous.    Yes Historical Provider, MD  lactulose (CHRONULAC) 10 GM/15ML solution Place 20 g into feeding tube daily.   Yes Historical Provider, MD  LORazepam (ATIVAN) 2 MG tablet 2 mg by PEG Tube route every 6 (six) hours.   Yes Historical Provider, MD  montelukast (SINGULAIR) 10 MG tablet Take 10 mg by mouth at bedtime.   Yes Historical Provider, MD  nicotine (NICODERM CQ - DOSED IN MG/24 HOURS) 21 mg/24hr patch Place 21 mg onto the skin daily.   Yes Historical Provider, MD  nystatin (MYCOSTATIN/NYSTOP) 100000 UNIT/GM POWD Apply 1 g topically every 8 (eight) hours.   Yes Historical Provider, MD  Oxycodone HCl 10 MG TABS 10 mg by PEG Tube route daily as needed (pain).   Yes Historical Provider, MD  predniSONE (DELTASONE) 20 MG tablet 20 mg by PEG Tube route daily with breakfast.   Yes Historical Provider, MD  QUEtiapine (SEROQUEL) 100 MG  tablet 100 mg by PEG Tube route every 12 (twelve) hours.   Yes Historical Provider, MD  senna (SENOKOT) 8.6 MG TABS tablet 1 tablet by PEG Tube route at bedtime.   Yes Historical Provider, MD  sertraline (ZOLOFT) 50 MG tablet 150 mg by PEG Tube route daily.    Yes Historical Provider, MD  thiamine 100 MG tablet 100 mg by PEG Tube route daily.   Yes Historical  Provider, MD  ciprofloxacin (CIPRO) 500 MG tablet Take 1 tablet (500 mg total) by mouth 2 (two) times daily. Patient not taking: Reported on 11/02/2015 05/12/15   Adrian SaranSital Mody, MD  metroNIDAZOLE (FLAGYL) 500 MG tablet Take 1 tablet (500 mg total) by mouth 3 (three) times daily. Patient not taking: Reported on 11/02/2015 05/12/15   Adrian SaranSital Mody, MD  pantoprazole (PROTONIX) 40 MG tablet Take 1 tablet (40 mg total) by mouth daily. Patient not taking: Reported on 11/02/2015 05/12/15   Adrian SaranSital Mody, MD  propranolol (INDERAL) 10 MG tablet Take 1 tablet (10 mg total) by mouth 3 (three) times daily. Patient not taking: Reported on 11/02/2015 05/12/15   Adrian SaranSital Mody, MD   Allergies  Allergen Reactions  . Cefazolin Hives  . Nsaids Other (See Comments)    Gi bleeds    FAMILY HISTORY:  has no family status information on file.  SOCIAL HISTORY:  reports that he has been smoking.  He has never used smokeless tobacco. He reports that he drinks alcohol. He reports that he does not use illicit drugs.  REVIEW OF SYSTEMS:  As per HPI  SUBJECTIVE:  Patient in bed with trach in place.  RASS -2  VITAL SIGNS: Temp:  [97.7 F (36.5 C)-99.7 F (37.6 C)] 97.7 F (36.5 C) (11/08 0045) Pulse Rate:  [74-115] 99 (11/08 0045) Resp:  [19-29] 20 (11/08 0030) BP: (114-200)/(59-109) 122/68 mmHg (11/08 0045) SpO2:  [85 %-100 %] 100 % (11/08 0045) FiO2 (%):  [50 %-60 %] 60 % (11/07 2332) Weight:  [125 lb (56.7 kg)] 125 lb (56.7 kg) (11/07 1929) HEMODYNAMICS:   VENTILATOR SETTINGS: Vent Mode:  [-] PRVC FiO2 (%):  [50 %-60 %] 60 % Set Rate:  [20 bmp] 20 bmp Vt Set:  [520 mL] 520 mL PEEP:  [5 cmH20] 5 cmH20 Plateau Pressure:  [34 cmH20-36 cmH20] 34 cmH20 INTAKE / OUTPUT:  Intake/Output Summary (Last 24 hours) at 11/03/15 0115 Last data filed at 11/02/15 1958  Gross per 24 hour  Intake      0 ml  Output    225 ml  Net   -225 ml    PHYSICAL EXAMINATION: General:  Chronically ill appearing patient with trach in  place Neuro:  Does not follow commands, patient is non verbal since intubation HEENT:  [place]/AT, temporal wasting Cardiovascular:  RRR, no murmurs Lungs:  Right chest tube in place, trach in place without surrounding erythema or purulence, poor air movement bilaterally Abdomen:  Protuberant abdomen, soft, PEG tube in place Musculoskeletal:  Cachetic, moves upper extremities independently, no edema, LE cool Skin:  Skin tears on b/l forearms/wrists  LABS:  CBC  Recent Labs Lab 11/02/15 1952  WBC 14.8*  HGB 9.0*  HCT 29.7*  PLT 125*   Coag's  Recent Labs Lab 11/02/15 1952  INR 1.24   BMET  Recent Labs Lab 11/02/15 1952  NA 134*  K 4.1  CL 93*  CO2 33*  BUN 20  CREATININE 1.38*  GLUCOSE 109*   Electrolytes  Recent Labs Lab 11/02/15 1952  CALCIUM 8.9  Sepsis Markers No results for input(s): LATICACIDVEN, PROCALCITON, O2SATVEN in the last 168 hours. ABG No results for input(s): PHART, PCO2ART, PO2ART in the last 168 hours. Liver Enzymes  Recent Labs Lab 11/02/15 1952  AST 27  ALT 17  ALKPHOS 98  BILITOT 0.9  ALBUMIN 2.6*   Cardiac Enzymes No results for input(s): TROPONINI, PROBNP in the last 168 hours. Glucose No results for input(s): GLUCAP in the last 168 hours.  Imaging Dg Chest Portable 1 View  11/03/2015  CLINICAL DATA:  Post chest tube insertion. History of COPD, cirrhosis. EXAM: PORTABLE CHEST 1 VIEW COMPARISON:  Chest radiograph November 02, 2015 FINDINGS: Interval placement of RIGHT apical chest tube with side port projecting within the chest wall. Stable appearance of RIGHT pneumothorax measuring up to 2.9 cm from the chest wall. No mediastinal shift. Interstitial prominence of the underlying collapsed lung. Interstitial prominence in the LEFT mid and lower lung zone. Tracheostomy tube tip projects at level of the clavicle heads. LEFT internal jugular central venous catheter projects at brachiocephalic confluence. IMPRESSION: Interval placement  of RIGHT chest tube, without significant decrease of RIGHT pneumothorax suggesting non compliant lung. Similar probably fibrotic changes LEFT lung. No apparent change an remaining life support lines. Electronically Signed   By: Awilda Metro M.D.   On: 11/03/2015 00:45   Dg Chest Portable 1 View  11/02/2015  CLINICAL DATA:  Reported pneumothorax. EXAM: PORTABLE CHEST 1 VIEW COMPARISON:  Chest x-ray dated 02/22/2014. FINDINGS: There is a right pneumothorax, moderate to large in size. Heart and mediastinal structures remain in the midline. Endotracheal tube well positioned with tip above the level of the carina. Patchy opacities within each lung could be edema or chronic fibrosis. IMPRESSION: 1. Right-sided pneumothorax, moderate to large in size, with pleural line displacement of approximately 3 cm. 2. Endotracheal tube well positioned with tip above the level of the carina. 3. Pulmonary edema versus chronic interstitial fibrosis. Critical Value/emergent results were called by telephone at the time of interpretation on 11/02/2015 at 8:23 pm to Dr. Tyrone Apple , who verbally acknowledged these results. Electronically Signed   By: Bary Richard M.D.   On: 11/02/2015 20:25     ASSESSMENT / PLAN:  PULMONARY Trach >>> A: R sided Pneumothorax, s/p chest tube w/ minimal improvement on f/u CXR VDRF s/p trach - due to inability to wean from vent following hip replacement Sept 2016 Tracheostomy dependence COPD by report - no PFT's in system Former smoker P:   Continue chest tube to suction Will order a CT of the chest today to verify CT placement as the PTX remains and there is no air from the chest tube. Full mechanical support. VAP prevention measures No SBT given his hx of inability to wean and new PTX Continue outpatient Arformoterol, Singulair Repeat CXR in AM  CARDIOVASCULAR CVL LIJ >>> A:  H/o hypertension, BP stable P:  Monitor hemodynamics  RENAL A:   Acute renal failure P:    NS @ 75 Correct electrolytes as indicated BMP in AM  GASTROINTESTINAL A:   PEG tube feeds Hyperammonemia H/o cirrhosis of liver & portal hypertension  GI prophylaxis P:   TF's per nutrition Continue outpatient lactulose, famotidine  HEMATOLOGIC A:   H/o DVT in RUE on Lovenox P:  Continue Lovenox Daily CBC  INFECTIOUS A:   H/o aspiration pneumonia, hcap  Currently afebrile and CXR unremarkable for infiltrates.  Mild leukocytosis likely acute phase reactant P:   Monitor, defer antibiotics for now PCT algorithm, if  high then consider abx  ENDOCRINE A:   No acute  issues P:   Monitor glucose on BMP  NEUROLOGIC A:   H/o metabolic encephalopathy with agitation Hx ETOH use P:   Sedation:  Fentanyl gtt / Versed PRN RASS goal: -2 Daily WUA Continue outpatient seroquel, zoloft - consider weaning as pt bed bound and basically non-interactive per daughter Thiamine / Folate   FAMILY  - Updates: updated daughter at bedside 11/7  - Inter-disciplinary family meet or Palliative Care meeting due by:  11/10/15  Rutherford Guys, PA - [facility] [place] [facility] Pager: [phone]  or [phone] 11/03/2015, 2:14 AM  Attending Note:  54 year old male with an extensive PMH who presents to [facility] with a new PTX.  EDP placed chest tube.  On CXR PTX remains and no air from chest tube while on suction.  Ordered a CT of the chest to localize the tube as I am concerned it is intra pulmonary.  May need another chest tube but clearly not emergent as patient is stable.  Diminished but air is moving on exam on the right with coarse BS diffusely.  Will f/u post chest CT.  Continue cirrhosis and psych medications otherwise.    The patient is critically ill with multiple organ systems failure and requires high complexity decision making for assessment and support, frequent evaluation and titration of therapies, application of advanced monitoring technologies and  extensive interpretation of multiple databases.   Critical Care Time devoted to patient care services described in this note is  35  Minutes. This time reflects time of care of this signee Dr Koren Bound. This critical care time does not reflect procedure time, or teaching time or supervisory time of PA/NP/Med student/Med Resident etc but could involve care discussion time.  Alyson Reedy, M.D. [facility] Pulmonary/Critical Care Medicine. Pager: [phone]. After hours pager: [phone].

## 2015-11-03 NOTE — Progress Notes (Signed)
Dilaudid drip discontinued. 50ml of dilaudid wis on arrival to 2S13 with Holland Fallingebecca Sarine RN

## 2015-11-03 NOTE — Care Management Note (Signed)
Case Management Note  Patient Details  Name: Albert Fowler MRN: [id number] Date of Birth: 06/28/1961  Subjective/Objective:   Talked with daughter, [name] on phone,  Verified patient is from [facility].  [facility]  Would like to go to [facility] when ready this time.  Updated physician - [facility] order placed.  Referral made to [facility].                   Action/Plan:   Expected Discharge Date:                  Expected Discharge Plan:  Long Term Acute Care (LTAC)  In-House Referral:  Clinical Social Work  Discharge planning Services  CM Consult  Post Acute Care Choice:    Choice offered to:     DME Arranged:    DME Agency:     HH Arranged:    HH Agency:     Status of Service:  In process, will continue to follow  Medicare Important Message Given:    Date Medicare IM Given:    Medicare IM give by:    Date Additional Medicare IM Given:    Additional Medicare Important Message give by:     If discussed at Long Length of Stay Meetings, dates discussed:    Additional Comments:  [name], [name] Jane, RN 11/03/2015, 1:56 PM

## 2015-11-03 NOTE — Plan of Care (Signed)
Problem: Pain Managment: Goal: General experience of comfort will improve Outcome: Completed/Met Date Met:  11/03/15 Pt on fentanyl drip

## 2015-11-03 NOTE — Procedures (Signed)
Chest Tube Insertion Procedure Note  Indications:  Clinically significant Pneumothorax  Pre-operative Diagnosis: Pneumothorax  Post-operative Diagnosis: Pneumothorax  Procedure Details  Informed consent was obtained for the procedure, including sedation.  Risks of lung perforation, hemorrhage, arrhythmia, and adverse drug reaction were discussed.   After sterile skin prep, using standard technique, a 32 French tube was placed in the right anterior 5 rib space.  Findings: 250 ml of serosanguinous fluid obtained  Estimated Blood Loss:  Minimal         Specimens:  None              Complications:  None; patient tolerated the procedure well.         Disposition: ICU - intubated and critically ill.         Condition: stable  Attending Attestation: I performed the procedure.  Alyson ReedyWesam G. Yacoub, M.D. [facility] Pulmonary/Critical Care Medicine. Pager: [phone]. After hours pager: [phone].

## 2015-11-03 NOTE — Progress Notes (Addendum)
Initial Nutrition Assessment  DOCUMENTATION CODES:   Not applicable  INTERVENTION:   Initiate Vital AF 1.2 formula at 20 ml/hr and increase by 10 ml every 4 hours to goal rate of 60 ml/hr  TF regimen to provide 1728 kcals, 108 gm protein, 1168 ml of free water  NUTRITION DIAGNOSIS:   Inadequate oral intake related to inability to eat as evidenced by NPO status  GOAL:   Patient will meet greater than or equal to 90% of their needs  MONITOR:   TF tolerance, Vent status, Weight trends, Labs, I & O's  REASON FOR ASSESSMENT:   Consult Enteral/tube feeding initiation and management  ASSESSMENT:   54 y.o. 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 [facility] after CXR at [facility] revealed a R sided pneumothorax. Chest tube inserted by ED provider and CCM called for admission.  Patient is currently on ventilator support -- trach MV: 10.7 L/min Temp (24hrs), Avg:98.3 F (36.8 C), Min:97 F (36.1 C), Max:99.7 F (37.6 C)   Noted pt with PEG tube in place; TF regimen at [facility] unknown at this time.  RD suspects malnutrition, however, unable to identify at this time.  Diet Order:  Diet NPO time specified  Skin:  Reviewed, no issues  Last BM:  N/A  Height:   Ht Readings from Last 1 Encounters:  11/02/15 5\' 9"  (1.753 m)    Weight:   Wt Readings from Last 1 Encounters:  11/03/15 131 lb 9.8 oz (59.7 kg)    Ideal Body Weight:  73 kg  BMI:  Body mass index is 19.43 kg/(m^2).  Estimated Nutritional Needs:   Kcal:  1778  Protein:  90-100 gm  Fluid:  per MD  EDUCATION NEEDS:   No education needs identified at this time  Maureen ChattersKatie Gloriann Riede, RD, LDN Pager #: [phone] After-Hours Pager #: [phone]

## 2015-11-04 ENCOUNTER — Inpatient Hospital Stay (HOSPITAL_COMMUNITY): Payer: Medicare Other

## 2015-11-04 DIAGNOSIS — K7030 Alcoholic cirrhosis of liver without ascites: Secondary | ICD-10-CM

## 2015-11-04 DIAGNOSIS — J9621 Acute and chronic respiratory failure with hypoxia: Secondary | ICD-10-CM

## 2015-11-04 LAB — GLUCOSE, CAPILLARY
GLUCOSE-CAPILLARY: 106 mg/dL — AB (ref 65–99)
GLUCOSE-CAPILLARY: 109 mg/dL — AB (ref 65–99)
GLUCOSE-CAPILLARY: 119 mg/dL — AB (ref 65–99)
Glucose-Capillary: 101 mg/dL — ABNORMAL HIGH (ref 65–99)
Glucose-Capillary: 146 mg/dL — ABNORMAL HIGH (ref 65–99)
Glucose-Capillary: 143 mg/dL — ABNORMAL HIGH (ref 65–99)

## 2015-11-04 NOTE — Clinical Documentation Improvement (Signed)
Cardiothoracic Critical Care  Based on the clinical findings below, please document any associated diagnoses/conditions the patient has or may have.   Malnutrition (if present, please document severity - severe, moderate, mild)  Other  Clinically Undetermined  Supporting Information:  Nursing flow sheets reflect height 5 feet 9 inches, weight 125 pounds, BMI 18.5 this admission.    Please exercise your independent, professional judgment when responding. A specific answer is not anticipated or expected. Please update your documentation within the medical record to reflect your response to this query.  Thank you, Doy MinceVangela Jeb Schloemer, RN [phone] Clinical Documentation Specialist

## 2015-11-04 NOTE — Progress Notes (Signed)
[facility] DAILY ICU PROGRESS NOTE                   [address]            [place] [id number]          [phone]        Total Length of Stay:  LOS: 1 day   Subjective: Trach in place, on full vent support... Doesn't respond to commands  Objective: Vital signs in last 24 hours: Temp:  [97 F (36.1 C)-97.9 F (36.6 C)] 97.6 F (36.4 C) (11/09 0355) Pulse Rate:  [69-125] 98 (11/09 0645) Cardiac Rhythm:  [-] Normal sinus rhythm (11/09 0400) Resp:  [13-33] 20 (11/09 0645) BP: (84-167)/(48-102) 136/65 mmHg (11/09 0645) SpO2:  [79 %-100 %] 100 % (11/09 0645) FiO2 (%):  [40 %-50 %] 40 % (11/09 0419) Weight:  [134 lb 7.7 oz (61 kg)] 134 lb 7.7 oz (61 kg) (11/09 0100)  Filed Weights   11/02/15 1929 11/03/15 0205 11/04/15 0100  Weight: 125 lb (56.7 kg) 131 lb 9.8 oz (59.7 kg) 134 lb 7.7 oz (61 kg)    Weight change: 9 lb 7.7 oz (4.3 kg)   Intake/Output from previous day: 11/08 0701 - 11/09 0700 In: 2911.2 [I.V.:2431.2; NG/GT:480] Out: 1705 [Urine:1245; Chest Tube:460]  Current Meds: Scheduled Meds: . antiseptic oral rinse  7 mL Mouth Rinse 10 times per day  . arformoterol  15 mcg Nebulization BID  . chlorhexidine gluconate  15 mL Mouth Rinse BID  . Chlorhexidine Gluconate Cloth  6 each Topical Q0600  . enoxaparin (LOVENOX) injection  80 mg Subcutaneous Q24H  . famotidine  20 mg Per Tube Daily  . fentaNYL (SUBLIMAZE) injection  50 mcg Intravenous Once  . folic acid  1 mg Per Tube Daily  . lactulose  20 g Per Tube Daily  . montelukast  10 mg Per Tube QHS  . mupirocin ointment  1 application Nasal BID  . predniSONE  20 mg Per Tube Q breakfast  . QUEtiapine  100 mg Per Tube Q12H  . sertraline  150 mg Per Tube Daily  . thiamine  100 mg Per Tube Daily   Continuous Infusions: . sodium chloride 75 mL/hr (11/04/15 0539)  . feeding supplement (VITAL AF 1.2 CAL) 1,000 mL (11/04/15 0600)  . fentaNYL infusion INTRAVENOUS Stopped (11/03/15 1400)  . phenylephrine  (NEO-SYNEPHRINE) Adult infusion Stopped (11/04/15 0520)  . propofol (DIPRIVAN) infusion 35 mcg/kg/min (11/04/15 0539)   PRN Meds:.sodium chloride, fentaNYL, midazolam, midazolam  General appearance: on vent Heart: regular rate and rhythm Lungs: diminished breath sounds bilaterally Wound: clean and dry  Lab Results: CBC: Recent Labs  11/02/15 1952 11/03/15 0403  WBC 14.8* 12.0*  HGB 9.0* 8.3*  HCT 29.7* 28.1*  PLT 125* 91*   BMET:  Recent Labs  11/02/15 1952 11/03/15 0403  NA 134* 136  K 4.1 3.8  CL 93* 95*  CO2 33* 32  GLUCOSE 109* 88  BUN 20 17  CREATININE 1.38* 1.29*  CALCIUM 8.9 8.4*    PT/INR:  Recent Labs  11/02/15 1952  LABPROT 15.8*  INR 1.24   Radiology: Ct Chest Wo Contrast  11/03/2015  CLINICAL DATA:  Evaluate chest tube as patient not doing well on ventilator. EXAM: CT CHEST WITHOUT CONTRAST TECHNIQUE: Multidetector CT imaging of the chest was performed following the standard protocol without IV contrast. COMPARISON:  Chest x-ray 11/03/2015 and chest CT 03/03/2008 FINDINGS: Tracheostomy tube is in adequate position. Left thigh  IJ central venous catheter has tip over the left brachycephalic vein just proximal to the SVC. Right-sided chest tube courses from the lateral right chest wall posteriorly, superficial to the bony thorax and pleural cavity with tip within the paraspinal soft tissues at the approximate T5 level. There is evidence of patient's known moderate size right pneumothorax. Small bilateral pleural effusions are present. There is evidence of fibrotic change within the mid to lower lungs. There is mixed interstitial airspace opacification over the lingula and left lower lobe with suggestion of similar changes within the collapsed right lung. Mild nodularity within the left upper lobe. Findings may be due to an acute infectious versus inflammatory process. Heart is normal size. Calcification and possible stent over the left anterior descending coronary  artery. Mild calcification of the thoracic aorta. No significant mediastinal, hilar or axillary adenopathy. Remaining mediastinal structures are within normal. Images through the upper abdomen demonstrate moderate ascites. Calcified splenic granulomas are present. Sludge versus small stones within the gallbladder. Evidence of cirrhosis with moderate nodularity to the liver contour. Percutaneous gastrostomy tube in adequate position. IMPRESSION: Known moderate size right pneumothorax. Note that the right-sided chest tube lies outside the right thoracic cavity posterior to the bony chest wall with tip within the paraspinal soft tissues at the level of T5. Small bilateral pleural effusions as well as mixed interstitial airspace process over the collapsed right lung as well as the lingula and left lower lobe with mild nodularity over the left upper lobe which may be due to an acute infectious or inflammatory process. Background of mild fibrotic change. Cirrhosis and moderate ascites. Remaining tubes and lines as described. Atherosclerotic coronary artery disease and possible stent involving the left anterior descending coronary artery. Electronically Signed   By: Elberta Fortis M.D.   On: 11/03/2015 16:03   Dg Chest Port 1 View  11/04/2015  CLINICAL DATA:  Esophageal surgery. EXAM: PORTABLE CHEST 1 VIEW COMPARISON:  11/03/2015.  11/02/2015.  02/22/2014. FINDINGS: Tracheostomy tube, left IJ line, right chest tube in stable position. Heart size normal. Unchanged bilateral pulmonary infiltrates. Although a component may be related chronic interstitial lung disease active bilateral pulmonary infiltrates cannot be excluded . Small bilateral pleural effusions versus pleural scarring noted. No pneumothorax. Mild right chest wall subcutaneous emphysema, improved from prior exam. IMPRESSION: 1. Tracheostomy tube, left IJ line, right chest tube in stable position. No pneumothorax. 2. Bilateral pulmonary interstitial prominence,  right side greater than left. Although a component of the interstitial prominence may be related to chronic interstitial lung disease, active bilateral pulmonary infiltrates cannot be excluded. Small bilateral pleural effusions versus pleural scarring noted. 3. Interim near complete resolution of right chest wall subcutaneous emphysema. Electronically Signed   By: Maisie Fus  Register   On: 11/04/2015 07:33   Dg Chest Port 1 View  11/03/2015  ADDENDUM REPORT: 11/03/2015 17:49 ADDENDUM: Additional considerations for the diffuse interstitial opacities within the right lung and left lung base include acute infectious or inflammatory process or pulmonary edema. Electronically Signed   By: Annia Belt M.D.   On: 11/03/2015 17:49  11/03/2015  CLINICAL DATA:  Patient with history of pneumothorax status post chest tube removal and re- insertion. EXAM: PORTABLE CHEST 1 VIEW COMPARISON:  CT chest 11/03/2015; chest radiograph 11/03/2015 FINDINGS: Tracheostomy tube stable in position. Left IJ central venous catheter tip projects over the superior vena cava. Monitoring leads overlie the patient. Right chest tube is present with tip terminating near the right lung apex. Stable cardiac and mediastinal contours.  No significant residual right-sided pneumothorax. Probable small right pleural effusion. Re- demonstrated coarse interstitial opacities throughout the right lung. Coarse interstitial opacities within the left lung base. IMPRESSION: New right chest tube with near complete resolution of previously described right pneumothorax. Re- demonstrated diffuse interstitial prominence throughout the right lung and left lung base likely representing fibrosis. Electronically Signed: By: Annia Belt M.D. On: 11/03/2015 17:31   Dg Chest Port 1 View  11/03/2015  CLINICAL DATA:  Chest tube in place. EXAM: PORTABLE CHEST 1 VIEW COMPARISON:  Chest x-rays from earlier same day and 11/02/2015. FINDINGS: The right-sided chest tube is stable in  position the right-sided pneumothorax is unchanged in size again measuring approximately 3 cm pleural displacement from the chest wall. No mediastinal shift. The interstitial prominence is again noted within the collapsed lung and also at the left lung base, unchanged. Tracheostomy tube remains well positioned with tip above the level of the carina. The left internal jugular central line remains adequately positioned with tip at the brachiocephalic confluence. IMPRESSION: Stable chest x-ray. Right-sided chest tube stable in position. Right-sided pneumothorax appears stable in size. Again noted is the diffuse interstitial prominence throughout the right lung and at the left lung base, presumably at least some component representing chronic interstitial fibrosis. No new lung findings. All tubes and lines stable in position. Electronically Signed   By: Bary Richard M.D.   On: 11/03/2015 15:09     Assessment/Plan:  1. Chest tube in place- CXR with re-expansion of lung, no pneumothorax, no air leak present- leave chest tube on suction 2. Care per pulmonary   Lowella Dandy 11/04/2015 7:54 AM

## 2015-11-04 NOTE — Progress Notes (Signed)
PULMONARY / CRITICAL CARE MEDICINE   Name: Albert Fowler MRN: [id number] DOB: 04/07/1961    ADMISSION DATE:  11/02/2015 CONSULTATION DATE:  11/03/15  REFERRING MD :  [facility]  CHIEF COMPLAINT:  Right pneumothorax  INITIAL PRESENTATION: 54 y.o. 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  [facility]7 after CXR at [facility] revealed a R sided pneumothorax.  Chest tube inserted by ED provider and CCM called for admission.  STUDIES:   CXR (11/7):  Right-sided pneumothorax, moderate to large in size, with pleural line displacement of approximately 3 cm. Endotracheal tube well positioned with tip above the level of the carina. Pulmonary edema versus chronic interstitial fibrosis.  CXR (11/8): Interval placement of RIGHT chest tube, without significant decrease of RIGHT pneumothorax suggesting non compliant lung.  SIGNIFICANT EVENTS: 11/8: admitted   HISTORY OF PRESENT ILLNESS:   History is provided by patient's daughter.  She reports that patient underwent a total L hip surgery in September of this year.  He required intubation a few days after surgery for respiratory failure secondary to aspiration pneumonia.  He was unable to wean from vent and subsequently required trach placement.  He has had at least 2 other admissions to ICU since that time with pneumonia.  On 11/7, patient had a chest xray to follow up on his. pneumonia and a right pneumothorax was found.  Patient was transferred from [facility] to [facility].  Patient has a h/o COPD, tobacco use, cirrhosis with varices, DVT of the upper extremity for which he take Lovenox.  SUBJECTIVE:  Patient in bed in no acute distress. Had chest tube replaced yesterday with reexpansion of lung.  VITAL SIGNS: Temp:  [97.5 F (36.4 C)-97.9 F (36.6 C)] 97.5 F (36.4 C) (11/09 0757) Pulse Rate:  [69-102] 102 (11/09 0900) Resp:  [14-33] 28 (11/09 0900) BP:  (84-146)/(48-102) 115/75 mmHg (11/09 0900) SpO2:  [97 %-100 %] 98 % (11/09 0900) FiO2 (%):  [40 %-50 %] 40 % (11/09 0843) Weight:  [61 kg (134 lb 7.7 oz)] 61 kg (134 lb 7.7 oz) (11/09 0100) HEMODYNAMICS:   VENTILATOR SETTINGS: Vent Mode:  [-] PCV FiO2 (%):  [40 %-50 %] 40 % Set Rate:  [20 bmp-30 bmp] 30 bmp Vt Set:  [520 mL] 520 mL PEEP:  [5 cmH20] 5 cmH20 Plateau Pressure:  [21 cmH20-39 cmH20] 30 cmH20 INTAKE / OUTPUT:  Intake/Output Summary (Last 24 hours) at 11/04/15 1108 Last data filed at 11/04/15 0900  Gross per 24 hour  Intake 3054.93 ml  Output   1535 ml  Net 1519.93 ml    PHYSICAL EXAMINATION: General:  Chronically ill appearing patient with trach in place Neuro:  Does not follow commands, patient is non verbal since intubation HEENT:  Albert Fowler/AT, temporal wasting Cardiovascular:  RRR, no murmurs Lungs:  Right chest tube in place, trach in place without surrounding erythema or purulence, poor air movement bilaterally Abdomen:  Protuberant abdomen, soft, PEG tube in place Musculoskeletal:  Cachetic, moves upper extremities independently, no edema, LE cool Skin:  Skin tears on b/l forearms/wrists  LABS:  CBC  Recent Labs Lab 11/02/15 1952 11/03/15 0403  WBC 14.8* 12.0*  HGB 9.0* 8.3*  HCT 29.7* 28.1*  PLT 125* 91*   Coag's  Recent Labs Lab 11/02/15 1952  INR 1.24   BMET  Recent Labs Lab 11/02/15 1952 11/03/15 0403  NA 134* 136  K 4.1 3.8  CL 93* 95*  CO2  33* 32  BUN 20 17  CREATININE 1.38* 1.29*  GLUCOSE 109* 88   Electrolytes  Recent Labs Lab 11/02/15 1952 11/03/15 0403  CALCIUM 8.9 8.4*  MG  --  1.3*  PHOS  --  3.3   Sepsis Markers  Recent Labs Lab 11/03/15 0402  PROCALCITON 0.21   ABG  Recent Labs Lab 11/03/15 0430  PHART 7.358  PCO2ART 56.6*  PO2ART 130*   Liver Enzymes  Recent Labs Lab 11/02/15 1952  AST 27  ALT 17  ALKPHOS 98  BILITOT 0.9  ALBUMIN 2.6*   Cardiac Enzymes No results for input(s): TROPONINI,  PROBNP in the last 168 hours. Glucose  Recent Labs Lab 11/03/15 0211 11/03/15 1922 11/03/15 2346 11/04/15 0336 11/04/15 0751  GLUCAP 84 101* 118* 106* 101*    Imaging Ct Chest Wo Contrast  11/03/2015  CLINICAL DATA:  Evaluate chest tube as patient not doing well on ventilator. EXAM: CT CHEST WITHOUT CONTRAST TECHNIQUE: Multidetector CT imaging of the chest was performed following the standard protocol without IV contrast. COMPARISON:  Chest x-ray 11/03/2015 and chest CT 03/03/2008 FINDINGS: Tracheostomy tube is in adequate position. Left thigh IJ central venous catheter has tip over the left brachycephalic vein just proximal to the SVC. Right-sided chest tube courses from the lateral right chest wall posteriorly, superficial to the bony thorax and pleural cavity with tip within the paraspinal soft tissues at the approximate T5 level. There is evidence of patient's known moderate size right pneumothorax. Small bilateral pleural effusions are present. There is evidence of fibrotic change within the mid to lower lungs. There is mixed interstitial airspace opacification over the lingula and left lower lobe with suggestion of similar changes within the collapsed right lung. Mild nodularity within the left upper lobe. Findings may be due to an acute infectious versus inflammatory process. Heart is normal size. Calcification and possible stent over the left anterior descending coronary artery. Mild calcification of the thoracic aorta. No significant mediastinal, hilar or axillary adenopathy. Remaining mediastinal structures are within normal. Images through the upper abdomen demonstrate moderate ascites. Calcified splenic granulomas are present. Sludge versus small stones within the gallbladder. Evidence of cirrhosis with moderate nodularity to the liver contour. Percutaneous gastrostomy tube in adequate position. IMPRESSION: Known moderate size right pneumothorax. Note that the right-sided chest tube lies  outside the right thoracic cavity posterior to the bony chest wall with tip within the paraspinal soft tissues at the level of T5. Small bilateral pleural effusions as well as mixed interstitial airspace process over the collapsed right lung as well as the lingula and left lower lobe with mild nodularity over the left upper lobe which may be due to an acute infectious or inflammatory process. Background of mild fibrotic change. Cirrhosis and moderate ascites. Remaining tubes and lines as described. Atherosclerotic coronary artery disease and possible stent involving the left anterior descending coronary artery. Electronically Signed   By: Elberta Fortis M.D.   On: 11/03/2015 16:03   Dg Chest Port 1 View  11/04/2015  CLINICAL DATA:  Esophageal surgery. EXAM: PORTABLE CHEST 1 VIEW COMPARISON:  11/03/2015.  11/02/2015.  02/22/2014. FINDINGS: Tracheostomy tube, left IJ line, right chest tube in stable position. Heart size normal. Unchanged bilateral pulmonary infiltrates. Although a component may be related chronic interstitial lung disease active bilateral pulmonary infiltrates cannot be excluded . Small bilateral pleural effusions versus pleural scarring noted. No pneumothorax. Mild right chest wall subcutaneous emphysema, improved from prior exam. IMPRESSION: 1. Tracheostomy tube, left IJ line, right  chest tube in stable position. No pneumothorax. 2. Bilateral pulmonary interstitial prominence, right side greater than left. Although a component of the interstitial prominence may be related to chronic interstitial lung disease, active bilateral pulmonary infiltrates cannot be excluded. Small bilateral pleural effusions versus pleural scarring noted. 3. Interim near complete resolution of right chest wall subcutaneous emphysema. Electronically Signed   By: Maisie Fus  Register   On: 11/04/2015 07:33   Dg Chest Port 1 View  11/03/2015  ADDENDUM REPORT: 11/03/2015 17:49 ADDENDUM: Additional considerations for the  diffuse interstitial opacities within the right lung and left lung base include acute infectious or inflammatory process or pulmonary edema. Electronically Signed   By: Annia Belt M.D.   On: 11/03/2015 17:49  11/03/2015  CLINICAL DATA:  Patient with history of pneumothorax status post chest tube removal and re- insertion. EXAM: PORTABLE CHEST 1 VIEW COMPARISON:  CT chest 11/03/2015; chest radiograph 11/03/2015 FINDINGS: Tracheostomy tube stable in position. Left IJ central venous catheter tip projects over the superior vena cava. Monitoring leads overlie the patient. Right chest tube is present with tip terminating near the right lung apex. Stable cardiac and mediastinal contours. No significant residual right-sided pneumothorax. Probable small right pleural effusion. Re- demonstrated coarse interstitial opacities throughout the right lung. Coarse interstitial opacities within the left lung base. IMPRESSION: New right chest tube with near complete resolution of previously described right pneumothorax. Re- demonstrated diffuse interstitial prominence throughout the right lung and left lung base likely representing fibrosis. Electronically Signed: By: Annia Belt M.D. On: 11/03/2015 17:31   Dg Chest Port 1 View  11/03/2015  CLINICAL DATA:  Chest tube in place. EXAM: PORTABLE CHEST 1 VIEW COMPARISON:  Chest x-rays from earlier same day and 11/02/2015. FINDINGS: The right-sided chest tube is stable in position the right-sided pneumothorax is unchanged in size again measuring approximately 3 cm pleural displacement from the chest wall. No mediastinal shift. The interstitial prominence is again noted within the collapsed lung and also at the left lung base, unchanged. Tracheostomy tube remains well positioned with tip above the level of the carina. The left internal jugular central line remains adequately positioned with tip at the brachiocephalic confluence. IMPRESSION: Stable chest x-ray. Right-sided chest tube  stable in position. Right-sided pneumothorax appears stable in size. Again noted is the diffuse interstitial prominence throughout the right lung and at the left lung base, presumably at least some component representing chronic interstitial fibrosis. No new lung findings. All tubes and lines stable in position. Electronically Signed   By: Bary Richard M.D.   On: 11/03/2015 15:09     ASSESSMENT / PLAN:  PULMONARY Trach >>> R chest tube 11/8 >>> A: R sided Pneumothorax, s/p chest tube w/ minimal improvement on f/u CXR VDRF s/p trach - due to inability to wean from vent following hip replacement Sept 2016 Tracheostomy dependence COPD by report - no PFT's in system Former smoker P:   Continue chest tube to suction 20 cm/H20 Full mechanical support. VAP prevention measures No SBT given his hx of inability to wean and PTX Continue outpatient Arformoterol, Singulair Repeat CXR in AM  CARDIOVASCULAR CVL LIJ >>> A:  H/o hypertension, BP stable P:  Monitor hemodynamics Place PICC to remove CVL   RENAL A:   Acute renal failure P:   NS @ 75 Correct electrolytes as indicated BMP in AM  GASTROINTESTINAL A:   PEG tube feeds Hyperammonemia H/o cirrhosis of liver & portal hypertension  GI prophylaxis P:   TF's per nutrition  Continue outpatient lactulose, famotidine  HEMATOLOGIC A:   H/o DVT in RUE on Lovenox P:  Continue Lovenox Daily CBC  INFECTIOUS A:   H/o aspiration pneumonia, hcap  Currently afebrile and CXR unremarkable for infiltrates.  Mild leukocytosis likely acute phase reactant P:   Monitor, defer antibiotics for now PCT algorithm, if high then consider abx  ENDOCRINE A:   No acute  issues P:   Monitor glucose on BMP  NEUROLOGIC A:   H/o metabolic encephalopathy with agitation Hx ETOH use P:   Sedation:  Fentanyl gtt / Versed PRN RASS goal: -2 Daily WUA Continue outpatient seroquel, zoloft - consider weaning as pt bed bound and basically  non-interactive per daughter Thiamine / Folate   Joneen Roach, AGACNP-BC [facility]/Critical Care Pager [phone] or [phone]  Attending Note:  54 year old male with extensive PMH who presents with respiratory failure that is chronic with a trach with a spontaneous PTX with pleural effusion.  Chest tube was malpositioned, repositioned yesterday.  Lung inflated after appropriate placement.  Output continues to be high.  Maintain chest tube to suction until output is less than 150 then will water seal.  Keep off abx.  Spoke with daughter, does not wish for patient to go back to [facility].  [facility] but will likely not discharge til AM.  Not arousable on exam on multiple psych medications and will need psych to evaluate once he arrives to a more permanent location.  The patient is critically ill with multiple organ systems failure and requires high complexity decision making for assessment and support, frequent evaluation and titration of therapies, application of advanced monitoring technologies and extensive interpretation of multiple databases.   Critical Care Time devoted to patient care services described in this note is  35  Minutes. This time reflects time of care of this signee Dr Koren Bound. This critical care time does not reflect procedure time, or teaching time or supervisory time of PA/NP/Med student/Med Resident etc but could involve care discussion time.  Alyson Reedy, M.D. [facility] Pulmonary/Critical Care Medicine. Pager: [phone]. After hours pager: [phone].  11/04/2015 11:21 AM

## 2015-11-04 NOTE — Progress Notes (Signed)
eLink Physician-Brief Progress Note Patient Name: Albert GivensJames H Goedken DOB: 01/24/1961 MRN: [id number]   Date of Service  11/04/2015  HPI/Events of Note  Anasarca, edema, no pressors  eICU Interventions  kvo Continued TF     Intervention Category Intermediate Interventions: Hypervolemia - evaluation and management  Nelda BucksFEINSTEIN,Larnce Schnackenberg J. 11/04/2015, 8:33 PM

## 2015-11-04 NOTE — Care Management Note (Signed)
Case Management Note  Patient Details  Name: Talitha GivensJames H Radigan MRN: [id number] Date of Birth: 08/09/1961  Subjective/Objective:   Talked with daughter, [name] on phone,  Verified patient is from [facility].  [facility] that [facility].  Would like to go to [facility] when ready this time.  Updated physician - [facility] order placed.  Referral made to [facility].   11-8- note by [name], Wilmoth Rasnic Jane, [phone]  11-04-15 [facility] will have a bed tomorrow 11-10 for patient.  Daughter agreeable for tx tomorrow.  Physician updated and plan for Discharge to [facility] tomorrow.                     Action/Plan:   Expected Discharge Date:                  Expected Discharge Plan:  Long Term Acute Care (LTAC)  In-House Referral:  Clinical Social Work  Discharge planning Services  CM Consult  Post Acute Care Choice:    Choice offered to:     DME Arranged:    DME Agency:     HH Arranged:    HH Agency:     Status of Service:  In process, will continue to follow  Medicare Important Message Given:    Date Medicare IM Given:    Medicare IM give by:    Date Additional Medicare IM Given:    Additional Medicare Important Message give by:     If discussed at Long Length of Stay Meetings, dates discussed:    Additional Comments:  [name], Keana Dueitt Jane, RN 11/04/2015, 12:06 PM

## 2015-11-05 ENCOUNTER — Other Ambulatory Visit (HOSPITAL_COMMUNITY): Payer: Self-pay

## 2015-11-05 ENCOUNTER — Inpatient Hospital Stay
Admission: AD | Admit: 2015-11-05 | Discharge: 2015-12-27 | Disposition: E | Payer: Self-pay | Source: Ambulatory Visit | Attending: Internal Medicine | Admitting: Internal Medicine

## 2015-11-05 ENCOUNTER — Inpatient Hospital Stay (HOSPITAL_COMMUNITY): Payer: Medicare Other

## 2015-11-05 DIAGNOSIS — Z452 Encounter for adjustment and management of vascular access device: Secondary | ICD-10-CM

## 2015-11-05 DIAGNOSIS — J189 Pneumonia, unspecified organism: Secondary | ICD-10-CM

## 2015-11-05 DIAGNOSIS — K7460 Unspecified cirrhosis of liver: Secondary | ICD-10-CM

## 2015-11-05 DIAGNOSIS — J9311 Primary spontaneous pneumothorax: Secondary | ICD-10-CM

## 2015-11-05 DIAGNOSIS — R935 Abnormal findings on diagnostic imaging of other abdominal regions, including retroperitoneum: Secondary | ICD-10-CM

## 2015-11-05 DIAGNOSIS — R140 Abdominal distension (gaseous): Secondary | ICD-10-CM

## 2015-11-05 DIAGNOSIS — Z431 Encounter for attention to gastrostomy: Secondary | ICD-10-CM

## 2015-11-05 DIAGNOSIS — Z515 Encounter for palliative care: Secondary | ICD-10-CM | POA: Insufficient documentation

## 2015-11-05 DIAGNOSIS — J939 Pneumothorax, unspecified: Secondary | ICD-10-CM

## 2015-11-05 DIAGNOSIS — Z09 Encounter for follow-up examination after completed treatment for conditions other than malignant neoplasm: Secondary | ICD-10-CM

## 2015-11-05 DIAGNOSIS — R188 Other ascites: Secondary | ICD-10-CM

## 2015-11-05 DIAGNOSIS — J9601 Acute respiratory failure with hypoxia: Secondary | ICD-10-CM | POA: Insufficient documentation

## 2015-11-05 DIAGNOSIS — E8770 Fluid overload, unspecified: Secondary | ICD-10-CM

## 2015-11-05 DIAGNOSIS — J9690 Respiratory failure, unspecified, unspecified whether with hypoxia or hypercapnia: Secondary | ICD-10-CM

## 2015-11-05 LAB — GLUCOSE, CAPILLARY
GLUCOSE-CAPILLARY: 114 mg/dL — AB (ref 65–99)
Glucose-Capillary: 124 mg/dL — ABNORMAL HIGH (ref 65–99)

## 2015-11-05 LAB — BLOOD GAS, ARTERIAL
ACID-BASE EXCESS: 4.7 mmol/L — AB (ref 0.0–2.0)
Bicarbonate: 31.5 mEq/L — ABNORMAL HIGH (ref 20.0–24.0)
FIO2: 0.4
O2 SAT: 95.9 %
PATIENT TEMPERATURE: 98.6
PCO2 ART: 74.6 mmHg — AB (ref 35.0–45.0)
PEEP/CPAP: 5 cmH2O
PH ART: 7.249 — AB (ref 7.350–7.450)
PO2 ART: 89.7 mmHg (ref 80.0–100.0)
PRESSURE CONTROL: 30 cmH2O
RATE: 30 resp/min
TCO2: 33.8 mmol/L (ref 0–100)

## 2015-11-05 LAB — BASIC METABOLIC PANEL
ANION GAP: 5 (ref 5–15)
BUN: 19 mg/dL (ref 6–20)
CALCIUM: 8.3 mg/dL — AB (ref 8.9–10.3)
CO2: 34 mmol/L — ABNORMAL HIGH (ref 22–32)
CREATININE: 1.14 mg/dL (ref 0.61–1.24)
Chloride: 100 mmol/L — ABNORMAL LOW (ref 101–111)
GLUCOSE: 125 mg/dL — AB (ref 65–99)
Potassium: 3.6 mmol/L (ref 3.5–5.1)
Sodium: 139 mmol/L (ref 135–145)

## 2015-11-05 LAB — CBC
HCT: 29.2 % — ABNORMAL LOW (ref 39.0–52.0)
Hemoglobin: 8.9 g/dL — ABNORMAL LOW (ref 13.0–17.0)
MCH: 29.5 pg (ref 26.0–34.0)
MCHC: 30.5 g/dL (ref 30.0–36.0)
MCV: 96.7 fL (ref 78.0–100.0)
PLATELETS: 105 10*3/uL — AB (ref 150–400)
RBC: 3.02 MIL/uL — ABNORMAL LOW (ref 4.22–5.81)
RDW: 17.2 % — AB (ref 11.5–15.5)
WBC: 13.4 10*3/uL — AB (ref 4.0–10.5)

## 2015-11-05 LAB — PHOSPHORUS: Phosphorus: 3.4 mg/dL (ref 2.5–4.6)

## 2015-11-05 LAB — MAGNESIUM: Magnesium: 1.4 mg/dL — ABNORMAL LOW (ref 1.7–2.4)

## 2015-11-05 LAB — PROCALCITONIN: PROCALCITONIN: 0.28 ng/mL

## 2015-11-05 MED ORDER — PHENYLEPHRINE HCL 10 MG/ML IJ SOLN: 30.0000 ug/min | INTRAVENOUS | Status: AC

## 2015-11-05 MED ORDER — VITAL AF 1.2 CAL PO LIQD: 1000.0000 mL | ORAL | Status: AC

## 2015-11-05 MED ORDER — METOPROLOL TARTRATE 1 MG/ML IV SOLN
2.5000 mg | INTRAVENOUS | Status: DC | PRN
  Administered 2015-11-05: 2.5 mg via INTRAVENOUS
  Filled 2015-11-05: qty 5

## 2015-11-05 MED ORDER — METOPROLOL TARTRATE 1 MG/ML IV SOLN: 2.5000 mg | INTRAVENOUS | Status: AC | PRN

## 2015-11-05 MED ORDER — PROPOFOL 1000 MG/100ML IV EMUL: 5.0000 ug/kg/min | INTRAVENOUS | Status: AC

## 2015-11-05 NOTE — Discharge Summary (Signed)
Physician Discharge Summary  Patient ID: Albert Fowler MRN: [id number] DOB/AGE: 53/12/1960 53 y.o.  Admit date: 11/02/2015 Discharge date: 11/25/2015  Problem List Active Problems:   Pneumothorax   Cirrhosis, alcoholic (HCC)   Acute on chronic respiratory failure with hypoxemia (HCC)   Tracheostomy status (HCC)  HPI: History is provided by patient's daughter. She reports that patient underwent a total L hip surgery in September of this year. He required intubation a few days after surgery for respiratory failure secondary to aspiration pneumonia. He was unable to wean from vent and subsequently required trach placement. He has had at least 2 other admissions to ICU since that time with pneumonia. On 11/7, patient had a chest xray to follow up on his pneumonia and a right pneumothorax was found. Patient was transferred from [facility] to [facility]. Patient has a h/o COPD, tobacco use, cirrhosis with varices, DVT of the upper extremity for which he take Lovenox.  PAST MEDICAL HISTORY :   has a past medical history of COPD (chronic obstructive pulmonary disease) (HCC); Asthma; Cirrhosis (HCC); Pneumothorax; Left knee pain; and Right hip pain.  has past surgical history that includes No past surgeries; Esophagogastroduodenoscopy (Left, 05/11/2015); Esophagogastroduodenoscopy (egd) with propofol (N/A, 07/23/2015); and esophageal banding (N/A, 07/23/2015). Prior to Admission medications         Hospital Course:  STUDIES:   CXR (11/7): Right-sided pneumothorax, moderate to large in size, with pleural line displacement of approximately 3 cm. Endotracheal tube well positioned with tip above the level of the carina. Pulmonary edema versus chronic interstitial fibrosis.  CXR (11/8): Interval placement of RIGHT chest tube, without significant decrease of RIGHT pneumothorax suggesting non compliant lung.  SIGNIFICANT EVENTS: 11/8: admitted  11/9 New chest on right placed and non functioning  chest tube removed. 32 fr 11/10 Transfer to [facility]    ASSESSMENT / PLAN:  PULMONARY Trach >>> R chest tube 11/8 >>>11/9 11/9 R chest tube 32 fr>> 11/9 PICC planned>> A: R sided Pneumothorax, s/p chest tube w/ minimal improvement on f/u CXR VDRF s/p trach - due to inability to wean from vent following hip replacement Sept 2016 Tracheostomy dependence COPD by report - no PFT's in system Former smoker P:  Continue chest tube to suction 20 cm/H20 Full mechanical support. VAP prevention measures No SBT given his hx of inability to wean and PTX Continue outpatient Arformoterol, Singulair Repeat CXR as needed  CARDIOVASCULAR CVL LIJ >>> A:  H/o hypertension, BP stable Tachycardia P:  Monitor hemodynamics Place PICC to remove CVL  BB as he was on BB at [facility]  RENAL Lab Results  Component Value Date   CREATININE 1.14 10/31/2015   CREATININE 1.29* 11/03/2015   CREATININE 1.38* 11/02/2015   CREATININE 0.86 02/22/2014   CREATININE 0.65 04/08/2013   CREATININE 0.78 02/13/2013    A:  Acute renal failure. Resolved  P:  NS @ 75 Correct electrolytes as indicated BMP as needed  GASTROINTESTINAL A:  PEG tube feeds Hyperammonemia H/o cirrhosis of liver & portal hypertension  GI prophylaxis P:  TF's per nutrition Continue outpatient lactulose, famotidine  HEMATOLOGIC A:  H/o DVT in RUE on Lovenox P:  Continue Lovenox Daily CBC  INFECTIOUS A:  H/o aspiration pneumonia, hcap  Currently afebrile and CXR unremarkable for infiltrates. Mild leukocytosis likely acute phase reactant P:  Monitor, defer antibiotics for now PCT algorithm,low  ENDOCRINE A:  No acute issues P:  Monitor glucose on BMP  NEUROLOGIC A:  H/o metabolic encephalopathy with agitation Hx ETOH  use P:  Sedation: Fentanyl gtt / Versed PRN RASS goal: -2 Daily WUA Continue outpatient seroquel, zoloft - consider weaning as pt bed bound and basically  non-interactive per daughter Consider dc diprivan and use po meds for agitation  Labs at discharge Lab Results  Component Value Date   CREATININE 1.14 Nov 21, 2015   BUN 19 21-Nov-2015   NA 139 11-21-2015   K 3.6 11/21/2015   CL 100* 2015-11-21   CO2 34* 11-21-2015   Lab Results  Component Value Date   WBC 13.4* Nov 21, 2015   HGB 8.9* November 21, 2015   HCT 29.2* 11/21/15   MCV 96.7 11/21/2015   PLT 105* Nov 21, 2015   Lab Results  Component Value Date   ALT 17 11/02/2015   AST 27 11/02/2015   ALKPHOS 98 11/02/2015   BILITOT 0.9 11/02/2015   Lab Results  Component Value Date   INR 1.24 11/02/2015   INR 1.07 05/10/2015    Current radiology studies Ct Chest Wo Contrast  11/03/2015  CLINICAL DATA:  Evaluate chest tube as patient not doing well on ventilator. EXAM: CT CHEST WITHOUT CONTRAST TECHNIQUE: Multidetector CT imaging of the chest was performed following the standard protocol without IV contrast. COMPARISON:  Chest x-ray 11/03/2015 and chest CT 03/03/2008 FINDINGS: Tracheostomy tube is in adequate position. Left thigh IJ central venous catheter has tip over the left brachycephalic vein just proximal to the SVC. Right-sided chest tube courses from the lateral right chest wall posteriorly, superficial to the bony thorax and pleural cavity with tip within the paraspinal soft tissues at the approximate T5 level. There is evidence of patient's known moderate size right pneumothorax. Small bilateral pleural effusions are present. There is evidence of fibrotic change within the mid to lower lungs. There is mixed interstitial airspace opacification over the lingula and left lower lobe with suggestion of similar changes within the collapsed right lung. Mild nodularity within the left upper lobe. Findings may be due to an acute infectious versus inflammatory process. Heart is normal size. Calcification and possible stent over the left anterior descending coronary artery. Mild calcification of the  thoracic aorta. No significant mediastinal, hilar or axillary adenopathy. Remaining mediastinal structures are within normal. Images through the upper abdomen demonstrate moderate ascites. Calcified splenic granulomas are present. Sludge versus small stones within the gallbladder. Evidence of cirrhosis with moderate nodularity to the liver contour. Percutaneous gastrostomy tube in adequate position. IMPRESSION: Known moderate size right pneumothorax. Note that the right-sided chest tube lies outside the right thoracic cavity posterior to the bony chest wall with tip within the paraspinal soft tissues at the level of T5. Small bilateral pleural effusions as well as mixed interstitial airspace process over the collapsed right lung as well as the lingula and left lower lobe with mild nodularity over the left upper lobe which may be due to an acute infectious or inflammatory process. Background of mild fibrotic change. Cirrhosis and moderate ascites. Remaining tubes and lines as described. Atherosclerotic coronary artery disease and possible stent involving the left anterior descending coronary artery. Electronically Signed   By: Elberta Fortis M.D.   On: 11/03/2015 16:03   Dg Chest Port 1 View  2015-11-21  CLINICAL DATA:  Pneumothorax. EXAM: PORTABLE CHEST 1 VIEW COMPARISON:  11/04/2015. FINDINGS: Tracheostomy tube, left IJ line in stable position. Right chest tube in stable position with tip in the right apex. No pneumothorax. Heart size stable. Stable pulmonary interstitial prominence right side greater than left. Although component of interstitial prominence is most likely chronic  interstitial lung disease, active bilateral pulmonary infiltrates cannot be excluded. No pleural effusions noted on today's exam. Left costophrenic angle is not imaged. IMPRESSION: 1. Tracheostomy tube, left IJ line, right chest tube in stable position. Right chest tube tip is in the right chest apex. No pneumothorax. 2. Stable  bilateral pulmonary interstitial disease, right side greater than left. Again a component this is most likely chronic however superimposed active interstitial disease cannot be excluded. Electronically Signed   By: Maisie Fus  Register   On: 2015-11-17 07:18   Dg Chest Port 1 View  11/04/2015  CLINICAL DATA:  Esophageal surgery. EXAM: PORTABLE CHEST 1 VIEW COMPARISON:  11/03/2015.  11/02/2015.  02/22/2014. FINDINGS: Tracheostomy tube, left IJ line, right chest tube in stable position. Heart size normal. Unchanged bilateral pulmonary infiltrates. Although a component may be related chronic interstitial lung disease active bilateral pulmonary infiltrates cannot be excluded . Small bilateral pleural effusions versus pleural scarring noted. No pneumothorax. Mild right chest wall subcutaneous emphysema, improved from prior exam. IMPRESSION: 1. Tracheostomy tube, left IJ line, right chest tube in stable position. No pneumothorax. 2. Bilateral pulmonary interstitial prominence, right side greater than left. Although a component of the interstitial prominence may be related to chronic interstitial lung disease, active bilateral pulmonary infiltrates cannot be excluded. Small bilateral pleural effusions versus pleural scarring noted. 3. Interim near complete resolution of right chest wall subcutaneous emphysema. Electronically Signed   By: Maisie Fus  Register   On: 11/04/2015 07:33   Dg Chest Port 1 View  11/03/2015  ADDENDUM REPORT: 11/03/2015 17:49 ADDENDUM: Additional considerations for the diffuse interstitial opacities within the right lung and left lung base include acute infectious or inflammatory process or pulmonary edema. Electronically Signed   By: Annia Belt M.D.   On: 11/03/2015 17:49  11/03/2015  CLINICAL DATA:  Patient with history of pneumothorax status post chest tube removal and re- insertion. EXAM: PORTABLE CHEST 1 VIEW COMPARISON:  CT chest 11/03/2015; chest radiograph 11/03/2015 FINDINGS: Tracheostomy  tube stable in position. Left IJ central venous catheter tip projects over the superior vena cava. Monitoring leads overlie the patient. Right chest tube is present with tip terminating near the right lung apex. Stable cardiac and mediastinal contours. No significant residual right-sided pneumothorax. Probable small right pleural effusion. Re- demonstrated coarse interstitial opacities throughout the right lung. Coarse interstitial opacities within the left lung base. IMPRESSION: New right chest tube with near complete resolution of previously described right pneumothorax. Re- demonstrated diffuse interstitial prominence throughout the right lung and left lung base likely representing fibrosis. Electronically Signed: By: Annia Belt M.D. On: 11/03/2015 17:31   Dg Chest Port 1 View  11/03/2015  CLINICAL DATA:  Chest tube in place. EXAM: PORTABLE CHEST 1 VIEW COMPARISON:  Chest x-rays from earlier same day and 11/02/2015. FINDINGS: The right-sided chest tube is stable in position the right-sided pneumothorax is unchanged in size again measuring approximately 3 cm pleural displacement from the chest wall. No mediastinal shift. The interstitial prominence is again noted within the collapsed lung and also at the left lung base, unchanged. Tracheostomy tube remains well positioned with tip above the level of the carina. The left internal jugular central line remains adequately positioned with tip at the brachiocephalic confluence. IMPRESSION: Stable chest x-ray. Right-sided chest tube stable in position. Right-sided pneumothorax appears stable in size. Again noted is the diffuse interstitial prominence throughout the right lung and at the left lung base, presumably at least some component representing chronic interstitial fibrosis. No new lung findings.  All tubes and lines stable in position. Electronically Signed   By: Bary Richard M.D.   On: 11/03/2015 15:09    Disposition:  01-Home or Self Care       Discharge Instructions    Discharge patient    Complete by:  As directed   Dc to [facility]            Medication List    STOP taking these medications        ciprofloxacin 500 MG tablet  Commonly known as:  CIPRO     HYDROMORPHONE HCL IJ     metroNIDAZOLE 500 MG tablet  Commonly known as:  FLAGYL     nicotine 21 mg/24hr patch  Commonly known as:  NICODERM CQ - dosed in mg/24 hours     nystatin 100000 UNIT/GM Powd     pantoprazole 40 MG tablet  Commonly known as:  PROTONIX     propranolol 10 MG tablet  Commonly known as:  INDERAL      TAKE these medications        arformoterol 15 MCG/2ML Nebu  Commonly known as:  BROVANA  Take 15 mcg by nebulization 2 (two) times daily.     chlorhexidine 0.12 % solution  Commonly known as:  PERIDEX  Use as directed 15 mLs in the mouth or throat every 6 (six) hours.     enoxaparin 80 MG/0.8ML injection  Commonly known as:  LOVENOX  Inject 80 mg into the skin daily.     famotidine 20 MG tablet  Commonly known as:  PEPCID  Take 20 mg by mouth daily.     feeding supplement (VITAL AF 1.2 CAL) Liqd  Place 1,000 mLs into feeding tube continuous.     lactulose 10 GM/15ML solution  Commonly known as:  CHRONULAC  Place 20 g into feeding tube daily.     LORazepam 2 MG tablet  Commonly known as:  ATIVAN  2 mg by PEG Tube route every 6 (six) hours.     metoprolol 1 MG/ML injection  Commonly known as:  LOPRESSOR  Inject 2.5-5 mLs (2.5-5 mg total) into the vein every 3 (three) hours as needed.     montelukast 10 MG tablet  Commonly known as:  SINGULAIR  Take 10 mg by mouth at bedtime.     Oxycodone HCl 10 MG Tabs  10 mg by PEG Tube route daily as needed (pain).     phenylephrine 10 mg in dextrose 5 % 250 mL  Inject 30-200 mcg/min into the vein continuous.     predniSONE 20 MG tablet  Commonly known as:  DELTASONE  20 mg by PEG Tube route daily with breakfast.     propofol 1000 MG/100ML Emul injection  Commonly known as:   DIPRIVAN  Inject 298.5-4,179 mcg/min into the vein continuous.     QUEtiapine 100 MG tablet  Commonly known as:  SEROQUEL  100 mg by PEG Tube route every 12 (twelve) hours.     senna 8.6 MG Tabs tablet  Commonly known as:  SENOKOT  1 tablet by PEG Tube route at bedtime.     sertraline 50 MG tablet  Commonly known as:  ZOLOFT  150 mg by PEG Tube route daily.     thiamine 100 MG tablet  100 mg by PEG Tube route daily.          Discharged Condition: poor  Time spent on discharge greater than 40 minutes.  Vital signs at Discharge. Temp:  [id number]  F (36.7 C)-99.2 F (37.3 C)] 99.1 F (37.3 C) (11/10 0817) Pulse Rate:  [88-120] 120 (11/10 0900) Resp:  [14-34] 25 (11/10 0900) BP: (85-154)/(61-91) 94/73 mmHg (11/10 0900) SpO2:  [90 %-100 %] 94 % (11/10 0900) FiO2 (%):  [40 %] 40 % (11/10 0747) Weight:  [135 lb 9.3 oz (61.5 kg)] 135 lb 9.3 oz (61.5 kg) (11/10 0400) SSH    up Special Information or instructions. Per [facility]. He now has a function chest tube for rt pnx PICC line to be placed in [facility]. Noe his tachycardia may be from BB withdrawal. He was on inderal 10 mg TID at [facility]. Signed: Brett Canales Minor ACNP Adolph Pollack PCCM Pager [phone] till 3 pm If no answer page [phone] 11-22-15, 9:51 AM  [facility].  Patient seen and examined, agree with above note.  I dictated the care and orders written for this patient under my direction.  Alyson Reedy, MD [phone]

## 2015-11-05 NOTE — Care Management Note (Signed)
Case Management Note  Patient Details  Name: Albert Fowler MRN: [id number] Date of Birth: 07/11/1961  Subjective/Objective:   Talked with daughter, Albert Fowler on phone,  Verified patient is from [facility].  [facility] to that was at [facility].  Would like to go to [facility] when ready this time.  Updated physician - [facility] order placed.  Referral made to [facility].   11-8- note by [name], Khayden Herzberg Jane, [phone]  11-04-15 [facility] will have a bed tomorrow 11-10 for patient.  Daughter agreeable for tx tomorrow.  Physician updated and plan for Discharge to [facility] tomorrow.    03-Jan-2015 Plan as above - to discharge to [facility] today.  Nurse and physician aware.  Plan to do PICC upstairs.                   Action/Plan:   Expected Discharge Date:                  Expected Discharge Plan:  Long Term Acute Care (LTAC)  In-House Referral:  Clinical Social Work  Discharge planning Services  CM Consult  Post Acute Care Choice:    Choice offered to:     DME Arranged:    DME Agency:     HH Arranged:    HH Agency:     Status of Service:  In process, will continue to follow  Medicare Important Message Given:    Date Medicare IM Given:    Medicare IM give by:    Date Additional Medicare IM Given:    Additional Medicare Important Message give by:     If discussed at Long Length of Stay Meetings, dates discussed:    Additional Comments:  [name], Bryton Waight Jane, RN 27-Jun-2015, 10:35 AM

## 2015-11-05 NOTE — Progress Notes (Addendum)
[facility] DAILY ICU PROGRESS NOTE                   [address]            [place] [address]          [phone]        Total Length of Stay:  LOS: 2 days   Subjective:  Intubated, baseline is noncommunicative  Objective: Vital signs in last 24 hours: Temp:  [98 F (36.7 C)-99.2 F (37.3 C)] 99.2 F (37.3 C) (11/10 0411) Pulse Rate:  [88-117] 117 (11/10 0747) Cardiac Rhythm:  [-] Normal sinus rhythm (11/10 0400) Resp:  [14-34] 33 (11/10 0747) BP: (85-154)/(61-91) 135/84 mmHg (11/10 0747) SpO2:  [93 %-100 %] 94 % (11/10 0747) FiO2 (%):  [40 %] 40 % (11/10 0747) Weight:  [135 lb 9.3 oz (61.5 kg)] 135 lb 9.3 oz (61.5 kg) (11/10 0400)  Filed Weights   11/03/15 0205 11/04/15 0100 2015-11-22 0400  Weight: 131 lb 9.8 oz (59.7 kg) 134 lb 7.7 oz (61 kg) 135 lb 9.3 oz (61.5 kg)    Weight change: 1 lb 1.6 oz (0.5 kg)   Intake/Output from previous day: 11/09 0701 - 11/10 0700 In: 2849.7 [I.V.:1439.7; NG/GT:1260] Out: 1190 [Urine:1050; Chest Tube:140]  Current Meds: Scheduled Meds: . antiseptic oral rinse  7 mL Mouth Rinse 10 times per day  . arformoterol  15 mcg Nebulization BID  . chlorhexidine gluconate  15 mL Mouth Rinse BID  . Chlorhexidine Gluconate Cloth  6 each Topical Q0600  . enoxaparin (LOVENOX) injection  80 mg Subcutaneous Q24H  . famotidine  20 mg Per Tube Daily  . fentaNYL (SUBLIMAZE) injection  50 mcg Intravenous Once  . folic acid  1 mg Per Tube Daily  . lactulose  20 g Per Tube Daily  . montelukast  10 mg Per Tube QHS  . mupirocin ointment  1 application Nasal BID  . predniSONE  20 mg Per Tube Q breakfast  . QUEtiapine  100 mg Per Tube Q12H  . sertraline  150 mg Per Tube Daily  . thiamine  100 mg Per Tube Daily   Continuous Infusions: . sodium chloride 10 mL/hr at 11/04/15 2033  . feeding supplement (VITAL AF 1.2 CAL) 1,000 mL (11/04/15 0600)  . fentaNYL infusion INTRAVENOUS Stopped (11/03/15 1400)  . phenylephrine (NEO-SYNEPHRINE)  Adult infusion Stopped (11/04/15 0520)  . propofol (DIPRIVAN) infusion 30 mcg/kg/min (2015-11-22 0611)   PRN Meds:.sodium chloride, fentaNYL, midazolam, midazolam  General appearance: alert, cooperative and no distress Heart: regular rate and rhythm Lungs: diminished breath sounds bilaterally Abdomen: soft, non-tender; bowel sounds normal; no masses,  no organomegaly Extremities: extremities normal, atraumatic, no cyanosis or edema Wound: clean and dry  Lab Results: CBC: Recent Labs  11/03/15 0403 11/22/15 0505  WBC 12.0* 13.4*  HGB 8.3* 8.9*  HCT 28.1* 29.2*  PLT 91* 105*   BMET:  Recent Labs  11/03/15 0403 11/22/2015 0505  NA 136 139  K 3.8 3.6  CL 95* 100*  CO2 32 34*  GLUCOSE 88 125*  BUN 17 19  CREATININE 1.29* 1.14  CALCIUM 8.4* 8.3*    PT/INR:  Recent Labs  11/02/15 1952  LABPROT 15.8*  INR 1.24   Radiology: Dg Chest Port 1 View  Nov 22, 2015  CLINICAL DATA:  Pneumothorax. EXAM: PORTABLE CHEST 1 VIEW COMPARISON:  11/04/2015. FINDINGS: Tracheostomy tube, left IJ line in stable position. Right chest tube in stable position with tip in the right apex.  No pneumothorax. Heart size stable. Stable pulmonary interstitial prominence right side greater than left. Although component of interstitial prominence is most likely chronic interstitial lung disease, active bilateral pulmonary infiltrates cannot be excluded. No pleural effusions noted on today's exam. Left costophrenic angle is not imaged. IMPRESSION: 1. Tracheostomy tube, left IJ line, right chest tube in stable position. Right chest tube tip is in the right chest apex. No pneumothorax. 2. Stable bilateral pulmonary interstitial disease, right side greater than left. Again a component this is most likely chronic however superimposed active interstitial disease cannot be excluded. Electronically Signed   By: Maisie Fushomas  Register   On: 11/22/2015 07:18     Assessment/Plan:  1. Chest tube- no air leak, no pneumothorax on  CXR- chest tube on suction 2. Dispo- care per CCM    BARRETT, ERIN 11/08/2015 8:08 AM  I have seen and examined the patient and agree with the assessment and plan as outlined.  Leave tube on suction today.  Could transition to water seal soon but would not remove tube any time soon.  Plan per Pulm/CCM team.  I spent in excess of 15 minutes during the conduct of this hospital encounter and >50% of this time involved direct face-to-face encounter with the patient for counseling and/or coordination of their care.   Purcell Nailslarence H Feliz Herard, MD 11/04/2015 8:21 AM

## 2015-11-05 NOTE — Progress Notes (Signed)
Transported pt from [facility] to [facility] Room 5740. Pt manually ventilated on transport with no complications. Report given to receiving RT at bedside

## 2015-11-05 NOTE — Progress Notes (Signed)
Attempted to contact daughter and notifiy her of pt transfer to [facility] room [phone]

## 2015-11-06 LAB — BLOOD GAS, ARTERIAL
ACID-BASE EXCESS: 6.4 mmol/L — AB (ref 0.0–2.0)
Bicarbonate: 31.7 mEq/L — ABNORMAL HIGH (ref 20.0–24.0)
FIO2: 0.5
O2 Saturation: 99.5 %
PEEP: 5 cmH2O
PH ART: 7.361 (ref 7.350–7.450)
PO2 ART: 141 mmHg — AB (ref 80.0–100.0)
PRESSURE CONTROL: 30 cmH2O
Patient temperature: 98.2
RATE: 30 resp/min
TCO2: 33.5 mmol/L (ref 0–100)
pCO2 arterial: 57.2 mmHg (ref 35.0–45.0)

## 2015-11-07 ENCOUNTER — Other Ambulatory Visit (HOSPITAL_COMMUNITY): Payer: Self-pay

## 2015-11-07 LAB — CBC
HCT: 30.3 % — ABNORMAL LOW (ref 39.0–52.0)
Hemoglobin: 8.9 g/dL — ABNORMAL LOW (ref 13.0–17.0)
MCH: 28.6 pg (ref 26.0–34.0)
MCHC: 29.4 g/dL — AB (ref 30.0–36.0)
MCV: 97.4 fL (ref 78.0–100.0)
PLATELETS: 129 10*3/uL — AB (ref 150–400)
RBC: 3.11 MIL/uL — AB (ref 4.22–5.81)
RDW: 16.8 % — ABNORMAL HIGH (ref 11.5–15.5)
WBC: 12.9 10*3/uL — ABNORMAL HIGH (ref 4.0–10.5)

## 2015-11-07 LAB — BASIC METABOLIC PANEL
Anion gap: 6 (ref 5–15)
BUN: 20 mg/dL (ref 6–20)
CHLORIDE: 98 mmol/L — AB (ref 101–111)
CO2: 36 mmol/L — ABNORMAL HIGH (ref 22–32)
CREATININE: 1.03 mg/dL (ref 0.61–1.24)
Calcium: 8.8 mg/dL — ABNORMAL LOW (ref 8.9–10.3)
GFR calc Af Amer: >60 mL/min (ref >60)
GLUCOSE: 134 mg/dL — AB (ref 65–99)
POTASSIUM: 3.8 mmol/L (ref 3.5–5.1)
Sodium: 140 mmol/L (ref 135–145)

## 2015-11-07 LAB — PROTIME-INR
INR: 1.14 (ref 0.00–1.49)
Prothrombin Time: 14.8 seconds (ref 11.6–15.2)

## 2015-11-07 LAB — MAGNESIUM: Magnesium: 1.6 mg/dL — ABNORMAL LOW (ref 1.7–2.4)

## 2015-11-07 MED ORDER — IOHEXOL 300 MG/ML  SOLN: 50.0000 mL | Freq: Once | INTRAMUSCULAR | Status: DC | PRN

## 2015-11-08 LAB — PROTIME-INR
INR: 1.3 (ref 0.00–1.49)
Prothrombin Time: 16.3 seconds — ABNORMAL HIGH (ref 11.6–15.2)

## 2015-11-09 ENCOUNTER — Other Ambulatory Visit (HOSPITAL_COMMUNITY): Payer: Self-pay

## 2015-11-09 DIAGNOSIS — J432 Centrilobular emphysema: Secondary | ICD-10-CM

## 2015-11-09 DIAGNOSIS — J9620 Acute and chronic respiratory failure, unspecified whether with hypoxia or hypercapnia: Secondary | ICD-10-CM

## 2015-11-09 LAB — CBC
HCT: 31.8 % — ABNORMAL LOW (ref 39.0–52.0)
HEMOGLOBIN: 9.5 g/dL — AB (ref 13.0–17.0)
MCH: 29.7 pg (ref 26.0–34.0)
MCHC: 29.9 g/dL — AB (ref 30.0–36.0)
MCV: 99.4 fL (ref 78.0–100.0)
Platelets: 174 10*3/uL (ref 150–400)
RBC: 3.2 MIL/uL — AB (ref 4.22–5.81)
RDW: 16.6 % — ABNORMAL HIGH (ref 11.5–15.5)
WBC: 15.9 10*3/uL — ABNORMAL HIGH (ref 4.0–10.5)

## 2015-11-09 LAB — RENAL FUNCTION PANEL
ANION GAP: 3 — AB (ref 5–15)
Albumin: 2.3 g/dL — ABNORMAL LOW (ref 3.5–5.0)
BUN: 24 mg/dL — ABNORMAL HIGH (ref 6–20)
CALCIUM: 9.2 mg/dL (ref 8.9–10.3)
CHLORIDE: 99 mmol/L — AB (ref 101–111)
CO2: 40 mmol/L — AB (ref 22–32)
Creatinine, Ser: 0.87 mg/dL (ref 0.61–1.24)
GFR calc non Af Amer: >60 mL/min (ref >60)
GLUCOSE: 153 mg/dL — AB (ref 65–99)
Phosphorus: 4.2 mg/dL (ref 2.5–4.6)
Potassium: 4.5 mmol/L (ref 3.5–5.1)
SODIUM: 142 mmol/L (ref 135–145)

## 2015-11-09 LAB — BRAIN NATRIURETIC PEPTIDE: B Natriuretic Peptide: 100.8 pg/mL — ABNORMAL HIGH (ref 0.0–100.0)

## 2015-11-09 LAB — PROTIME-INR
INR: 2.98 — ABNORMAL HIGH (ref 0.00–1.49)
PROTHROMBIN TIME: 30.4 s — AB (ref 11.6–15.2)

## 2015-11-09 NOTE — Consult Note (Signed)
PULMONARY / CRITICAL CARE MEDICINE   Name: Albert Fowler MRN: [id number] DOB: [birth year]    ADMISSION Albert Fowler CONSULTATION DATE:  11/03/15  REFERRING MD :  [facility]  CHIEF COMPLAINT:  Right pneumothorax  INITIAL PRESENTATION: 54 y.o. 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  Brought to [facility] 11/7 after CXR at [facility] revealed a R sided pneumothorax.  Chest tube placed by ED provider  [facility] has a h/o COPD, tobacco use, cirrhosis with varices, DVT of the upper extremity on Lovenox.  STUDIES:   CXR (11/7):  Right-sided pneumothorax, moderate to large in size   SIGNIFICANT EVENTS: 11/8: admitted   HISTORY OF PRESENT ILLNESS:   History is provided by patient's daughter.  She reports that patient underwent a total L hip surgery in September of this year.  He required intubation a few days after surgery for respiratory failure secondary to aspiration pneumonia.  He was unable to wean from vent and subsequently required trach placement.  He has had at least 2 other admissions to ICU since that time with pneumonia.  On 11/7, patient had a chest xray to follow up on his pneumonia and a right pneumothorax was found.  Patient was transferred from [facility] to [facility].  Patient has a h/o COPD, tobacco use, cirrhosis with varices, DVT of the upper extremity for which he take Lovenox.  PAST MEDICAL HISTORY :   has a past medical history of COPD (chronic obstructive pulmonary disease) (HCC); Asthma; Cirrhosis (HCC); Pneumothorax; Left knee pain; and Right hip pain.  has past surgical history that includes No past surgeries; Esophagogastroduodenoscopy (Left, 05/11/2015); Esophagogastroduodenoscopy (egd) with propofol (N/A, 07/23/2015); and esophageal banding (N/A, 07/23/2015). Prior to Admission medications   Medication Sig Start Date End Date Taking? Authorizing Provider  arformoterol  (BROVANA) 15 MCG/2ML NEBU Take 15 mcg by nebulization 2 (two) times daily.   Yes Historical Provider, MD  chlorhexidine (PERIDEX) 0.12 % solution Use as directed 15 mLs in the mouth or throat every 6 (six) hours.   Yes Historical Provider, MD  enoxaparin (LOVENOX) 80 MG/0.8ML injection Inject 80 mg into the skin daily.   Yes Historical Provider, MD  famotidine (PEPCID) 20 MG tablet Take 20 mg by mouth daily.   Yes Historical Provider, MD  HYDROMORPHONE HCL IJ Inject 1 mg/hr as directed continuous.    Yes Historical Provider, MD  lactulose (CHRONULAC) 10 GM/15ML solution Place 20 g into feeding tube daily.   Yes Historical Provider, MD  LORazepam (ATIVAN) 2 MG tablet 2 mg by PEG Tube route every 6 (six) hours.   Yes Historical Provider, MD  montelukast (SINGULAIR) 10 MG tablet Take 10 mg by mouth at bedtime.   Yes Historical Provider, MD  nicotine (NICODERM CQ - DOSED IN MG/24 HOURS) 21 mg/24hr patch Place 21 mg onto the skin daily.   Yes Historical Provider, MD  nystatin (MYCOSTATIN/NYSTOP) 100000 UNIT/GM POWD Apply 1 g topically every 8 (eight) hours.   Yes Historical Provider, MD  Oxycodone HCl 10 MG TABS 10 mg by PEG Tube route daily as needed (pain).   Yes Historical Provider, MD  predniSONE (DELTASONE) 20 MG tablet 20 mg by PEG Tube route daily with breakfast.   Yes Historical Provider, MD  QUEtiapine (SEROQUEL) 100 MG tablet 100 mg by PEG Tube route every 12 (twelve) hours.   Yes Historical Provider, MD  senna (SENOKOT) 8.6 MG TABS tablet 1  tablet by PEG Tube route at bedtime.   Yes Historical Provider, MD  sertraline (ZOLOFT) 50 MG tablet 150 mg by PEG Tube route daily.    Yes Historical Provider, MD  thiamine 100 MG tablet 100 mg by PEG Tube route daily.   Yes Historical Provider, MD  ciprofloxacin (CIPRO) 500 MG tablet Take 1 tablet (500 mg total) by mouth 2 (two) times daily. Patient not taking: Reported on 11/02/2015 05/12/15   Albert Saran, MD  metroNIDAZOLE (FLAGYL) 500 MG tablet Take 1  tablet (500 mg total) by mouth 3 (three) times daily. Patient not taking: Reported on 11/02/2015 05/12/15   Albert Saran, MD  pantoprazole (PROTONIX) 40 MG tablet Take 1 tablet (40 mg total) by mouth daily. Patient not taking: Reported on 11/02/2015 05/12/15   Albert Saran, MD  propranolol (INDERAL) 10 MG tablet Take 1 tablet (10 mg total) by mouth 3 (three) times daily. Patient not taking: Reported on 11/02/2015 05/12/15   Albert Saran, MD   Allergies  Allergen Reactions  . Cefazolin Hives  . Nsaids Other (See Comments)    Gi bleeds    FAMILY HISTORY:  has no family status information on file.  SOCIAL HISTORY:  reports that he has been smoking.  He has never used smokeless tobacco. He reports that he drinks alcohol. He reports that he does not use illicit drugs.  REVIEW OF SYSTEMS:  Unable to obtain  SUBJECTIVE:  In bed, denies pain, high peak pressures on ventilator  VITAL SIGNS:   HEMODYNAMICS:   VENTILATOR SETTINGS:   INTAKE / OUTPUT: No intake or output data in the 24 hours ending 11/09/15 1232  PHYSICAL EXAMINATION: General:  Chronically ill appearing patient with trach in place Neuro:   Follow 1 step commands, HEENT:  [place]/AT, temporal wasting Cardiovascular:  RRR, no murmurs Lungs:  Right chest tube in place, trach in place without surrounding erythema or purulence, poor air movement bilaterally Abdomen:  Distended abdomen, soft, PEG tube in place Musculoskeletal:  Cachetic, moves upper extremities independently, no edema, LE cool Skin:  Skin tears on b/l forearms/wrists  LABS:  CBC  Recent Labs Lab 11/04/2015 0505 11/07/15 0659 11/09/15 0655  WBC 13.4* 12.9* 15.9*  HGB 8.9* 8.9* 9.5*  HCT 29.2* 30.3* 31.8*  PLT 105* 129* 174   Coag's  Recent Labs Lab 11/07/15 1400 11/08/15 0525 11/09/15 0655  INR 1.14 1.30 2.98*   BMET  Recent Labs Lab 10/30/2015 0505 11/07/15 0659 11/09/15 0655  NA 139 140 142  K 3.6 3.8 4.5  CL 100* 98* 99*  CO2 34* 36* 40*  BUN  19 20 24*  CREATININE 1.14 1.03 0.87  GLUCOSE 125* 134* 153*   Electrolytes  Recent Labs Lab 11/03/15 0403 11/08/2015 0505 11/07/15 0659 11/09/15 0655  CALCIUM 8.4* 8.3* 8.8* 9.2  MG 1.3* 1.4* 1.6*  --   PHOS 3.3 3.4  --  4.2   Sepsis Markers  Recent Labs Lab 11/03/15 0402 10/28/2015 0505  PROCALCITON 0.21 0.28   ABG  Recent Labs Lab 11/03/15 0430 11/07/2015 1730 11/06/15 0530  PHART 7.358 7.249* 7.361  PCO2ART 56.6* 74.6* 57.2*  PO2ART 130* 89.7 141*   Liver Enzymes  Recent Labs Lab 11/02/15 1952 11/09/15 0655  AST 27  --   ALT 17  --   ALKPHOS 98  --   BILITOT 0.9  --   ALBUMIN 2.6* 2.3*   Cardiac Enzymes No results for input(s): TROPONINI, PROBNP in the last 168 hours. Glucose  Recent Labs Lab  11/04/15 1221 11/04/15 1650 11/04/15 1958 11/04/15 2326 10/30/2015 0406 10/29/2015 0810  GLUCAP 146* 143* 109* 119* 114* 124*    Imaging Dg Chest Port 1 View  11/09/2015  CLINICAL DATA:  Respiratory failure, tracheostomy patient, right-sided chest tube placement. EXAM: PORTABLE CHEST 1 VIEW COMPARISON:  Portable chest x-ray of November 07, 2015 FINDINGS: The lungs remain borderline hypoinflated. The right-sided chest tube is unchanged with its tip in the pulmonary apex. There is no pneumothorax or significant pleural effusion on the right. On the left a small pleural effusion blunts the lateral costophrenic angle. Both lungs exhibit increased interstitial density greater on the right than on the left. The heart is normal in size. The central pulmonary vascularity is engorged and indistinct. The tracheostomy appliance tip projects at the level of the inferior margin of the clavicular heads 4.4 cm above the carina. IMPRESSION: Stable appearance of the chest since yesterday's study. Persistent bilateral interstitial infiltrates suggest pneumonia. Asymmetric pulmonary edema could produce similar findings. The right chest tube is in stable position. Electronically Signed    By: David  SwazilandJordan M.D.   On: 11/09/2015 07:53     ASSESSMENT / PLAN:  PULMONARY Trach >>> A: R sided Pneumothorax, s/p chest tube -no air leak VDRF s/p trach - due to inability to wean from vent following hip replacement Sept 2016 Tracheostomy dependence COPD by report - no PFT's in system Former smoker P:   Continue chest tube to water seal - we'll consider discontinuing once closer to discharge Continue weaning efforts-but he has noted no progress in the past and has been deemed vent dependent Continue outpatient Arformoterol, Singulair   GASTROINTESTINAL A:   PEG tube feeds Hyperammonemia H/o cirrhosis of liver & portal hypertension  GI prophylaxis P:   TF's per nutrition Continue outpatient lactulose, famotidine  HEMATOLOGIC A:   H/o DVT in RUE on Lovenox P:  Continue Lovenox Daily CBC  INFECTIOUS A:   H/o aspiration pneumonia, hcap  Currently afebrile and CXR unremarkable for infiltrates.  Mild leukocytosis likely acute phase reactant P:   Monitor, defer antibiotics to primary service   NEUROLOGIC A:   H/o metabolic encephalopathy with agitation Hx ETOH use P:   Sedation:  Fentanyl prn / Versed PRN RASS goal: 0  Jessiah Wojnar V.  [phone]   11/09/2015, 12:32 PM

## 2015-11-10 ENCOUNTER — Other Ambulatory Visit (HOSPITAL_COMMUNITY): Payer: Self-pay

## 2015-11-10 LAB — PROTIME-INR
INR: 2.85 — AB (ref 0.00–1.49)
Prothrombin Time: 29.4 seconds — ABNORMAL HIGH (ref 11.6–15.2)

## 2015-11-11 DIAGNOSIS — J449 Chronic obstructive pulmonary disease, unspecified: Secondary | ICD-10-CM

## 2015-11-11 DIAGNOSIS — J9610 Chronic respiratory failure, unspecified whether with hypoxia or hypercapnia: Secondary | ICD-10-CM

## 2015-11-11 DIAGNOSIS — J939 Pneumothorax, unspecified: Secondary | ICD-10-CM

## 2015-11-11 LAB — PROTIME-INR
INR: 2.03 — ABNORMAL HIGH (ref 0.00–1.49)
Prothrombin Time: 22.8 seconds — ABNORMAL HIGH (ref 11.6–15.2)

## 2015-11-11 LAB — BRAIN NATRIURETIC PEPTIDE: B Natriuretic Peptide: 96.8 pg/mL (ref 0.0–100.0)

## 2015-11-11 NOTE — Progress Notes (Signed)
PULMONARY / CRITICAL CARE MEDICINE   Name: Albert GivensJames H Fowler MRN: [id number] DOB: 05/13/1961    ADMISSION DATE:  10/30/2015 CONSULTATION DATE:  11/03/15  [facility]  CHIEF COMPLAINT:  Right pneumothorax  INITIAL PRESENTATION: 54 y.o. 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  [facility] at [facility] revealed a R sided pneumothorax.  Chest tube placed by ED provider  [facility] has a h/o COPD, tobacco use, cirrhosis with varices, DVT of the upper extremity on Lovenox.  STUDIES:   CXR (11/7):  Right-sided pneumothorax, moderate to large in size   SIGNIFICANT EVENTS: 11/8: admitted   HISTORY OF PRESENT ILLNESS:   History is provided by patient's daughter.  She reports that patient underwent a total L hip surgery in September of this year.  He required intubation a few days after surgery for respiratory failure secondary to aspiration pneumonia.  He was unable to wean from vent and subsequently required trach placement.  He has had at least 2 other admissions to ICU since that time with pneumonia.  On 11/7, patient had a chest xray to follow up on his pneumonia and a right pneumothorax was found.  [facility]  Patient has a h/o COPD, tobacco use, cirrhosis with varices, DVT of the upper extremity for which he take Lovenox.    SUBJECTIVE:  In bed, denies pain, high peak pressures on ventilator  VITAL SIGNS:   HEMODYNAMICS:   VENTILATOR SETTINGS:   INTAKE / OUTPUT: No intake or output data in the 24 hours ending 11/11/15 1106  PHYSICAL EXAMINATION: General:  Chronically ill appearing patient with trach in place. Neuro:   Follow 1 step commands, HEENT:  [place]/AT, temporal wasting Cardiovascular:  RRR, no murmurs Lungs:  Right chest tube in place, trach in place without surrounding erythema or purulence, poor air  movement bilaterally Abdomen:  Distended abdomen, soft, PEG tube in place Musculoskeletal:  Cachetic, moves upper extremities independently, no edema, LE cool Skin:  Skin tears on b/l forearms/wrists  LABS:  CBC  Recent Labs Lab 11/02/2015 0505 11/07/15 0659 11/09/15 0655  WBC 13.4* 12.9* 15.9*  HGB 8.9* 8.9* 9.5*  HCT 29.2* 30.3* 31.8*  PLT 105* 129* 174   Coag's  Recent Labs Lab 11/09/15 0655 11/10/15 0630 11/11/15 0630  INR 2.98* 2.85* 2.03*   BMET  Recent Labs Lab 11/06/2015 0505 11/07/15 0659 11/09/15 0655  NA 139 140 142  K 3.6 3.8 4.5  CL 100* 98* 99*  CO2 34* 36* 40*  BUN 19 20 24*  CREATININE 1.14 1.03 0.87  GLUCOSE 125* 134* 153*   Electrolytes  Recent Labs Lab 11/03/2015 0505 11/07/15 0659 11/09/15 0655  CALCIUM 8.3* 8.8* 9.2  MG 1.4* 1.6*  --   PHOS 3.4  --  4.2   Sepsis Markers  Recent Labs Lab 11/21/2015 0505  PROCALCITON 0.28   ABG  Recent Labs Lab 11/19/2015 1730 11/06/15 0530  PHART 7.249* 7.361  PCO2ART 74.6* 57.2*  PO2ART 89.7 141*   Liver Enzymes  Recent Labs Lab 11/09/15 0655  ALBUMIN 2.3*   Cardiac Enzymes No results for input(s): TROPONINI, PROBNP in the last 168 hours. Glucose  Recent Labs Lab 11/04/15 1221 11/04/15 1650 11/04/15 1958 11/04/15 2326 11/06/2015 0406 11/20/2015 0810  GLUCAP 146* 143* 109* 119* 114* 124*    Imaging Dg Abd Portable 1v  11/10/2015  CLINICAL DATA:  Abdominal distension EXAM: PORTABLE ABDOMEN - 1 VIEW COMPARISON:  11/07/2015 FINDINGS: Gastrostomy catheter is again identified. Scattered large and small bowel gas is noted. No obstructive changes are seen. Chronic changes are noted in the lung bases bilaterally. No acute bony abnormality is seen. IMPRESSION: No acute abnormality noted. Electronically Signed   By: Alcide Clever M.D.   On: 11/10/2015 17:15     ASSESSMENT / PLAN:  PULMONARY Trach >>> A: R sided Pneumothorax, s/p chest tube -no air leak VDRF s/p trach - due to  inability to wean from vent following hip replacement Sept 2016 Tracheostomy dependence COPD by report - no PFT's in system Former smoker P:   Continue chest tube to water seal - we'll consider discontinuing in near future. Check CxR Continue weaning efforts-but he has noted no progress in the past and has been deemed vent dependent Continue outpatient Arformoterol, Singulair   GASTROINTESTINAL A:   PEG tube feeds Hyperammonemia H/o cirrhosis of liver & portal hypertension  GI prophylaxis P:   TF's per nutrition Continue outpatient lactulose, famotidine  HEMATOLOGIC A:   H/o DVT in RUE on Lovenox P:  Continue Lovenox Daily CBC  INFECTIOUS A:   H/o aspiration pneumonia, hcap  Currently afebrile and CXR unremarkable for infiltrates.  Mild leukocytosis likely acute phase reactant P:   Monitor, defer antibiotics to primary service   NEUROLOGIC A:   H/o metabolic encephalopathy with agitation Hx ETOH use P:   Sedation:  Fentanyl prn / Versed PRN RASS goal: 0  Brett Canales Minor ACNP [facility] Pager [phone] till 3 pm If no answer page [phone] 11/11/2015, 11:06 AM

## 2015-11-12 ENCOUNTER — Other Ambulatory Visit (HOSPITAL_COMMUNITY): Payer: Self-pay

## 2015-11-12 LAB — CBC
HCT: 29.7 % — ABNORMAL LOW (ref 39.0–52.0)
HEMOGLOBIN: 9.1 g/dL — AB (ref 13.0–17.0)
MCH: 29.2 pg (ref 26.0–34.0)
MCHC: 30.6 g/dL (ref 30.0–36.0)
MCV: 95.2 fL (ref 78.0–100.0)
PLATELETS: 150 10*3/uL (ref 150–400)
RBC: 3.12 MIL/uL — AB (ref 4.22–5.81)
RDW: 16.4 % — ABNORMAL HIGH (ref 11.5–15.5)
WBC: 12.4 10*3/uL — AB (ref 4.0–10.5)

## 2015-11-12 LAB — BODY FLUID CELL COUNT WITH DIFFERENTIAL
Lymphs, Fluid: 5 %
Monocyte-Macrophage-Serous Fluid: 13 % — ABNORMAL LOW (ref 50–90)
NEUTROPHIL FLUID: 82 % — AB (ref 0–25)
WBC FLUID: 1148 uL — AB (ref 0–1000)

## 2015-11-12 LAB — GRAM STAIN

## 2015-11-12 LAB — PROTIME-INR
INR: 2.52 — ABNORMAL HIGH (ref 0.00–1.49)
PROTHROMBIN TIME: 26.8 s — AB (ref 11.6–15.2)

## 2015-11-12 MED ORDER — LIDOCAINE HCL (PF) 1 % IJ SOLN
INTRAMUSCULAR | Status: AC
  Filled 2015-11-12: qty 10

## 2015-11-12 NOTE — Procedures (Signed)
Successful US guided paracentesis from RLQ.  Yielded 5 liters of hazy yellow fluid.  No immediate complications.  Pt tolerated well.   Specimen was sent for labs.  Jaqlyn Gruenhagen S Kai Railsback PA-C 11/12/2015 11:10 AM

## 2015-11-13 LAB — PROTIME-INR
INR: 3.93 — AB (ref 0.00–1.49)
Prothrombin Time: 37.5 seconds — ABNORMAL HIGH (ref 11.6–15.2)

## 2015-11-13 LAB — PATHOLOGIST SMEAR REVIEW

## 2015-11-14 LAB — BASIC METABOLIC PANEL
Anion gap: 10 (ref 5–15)
BUN: 44 mg/dL — AB (ref 6–20)
CALCIUM: 8.9 mg/dL (ref 8.9–10.3)
CO2: 36 mmol/L — ABNORMAL HIGH (ref 22–32)
CREATININE: 1.13 mg/dL (ref 0.61–1.24)
Chloride: 100 mmol/L — ABNORMAL LOW (ref 101–111)
GFR calc Af Amer: >60 mL/min (ref >60)
GLUCOSE: 136 mg/dL — AB (ref 65–99)
POTASSIUM: 5.3 mmol/L — AB (ref 3.5–5.1)
SODIUM: 146 mmol/L — AB (ref 135–145)

## 2015-11-14 LAB — CBC
HCT: 32.1 % — ABNORMAL LOW (ref 39.0–52.0)
Hemoglobin: 9.9 g/dL — ABNORMAL LOW (ref 13.0–17.0)
MCH: 28.9 pg (ref 26.0–34.0)
MCHC: 30.8 g/dL (ref 30.0–36.0)
MCV: 93.6 fL (ref 78.0–100.0)
PLATELETS: 151 10*3/uL (ref 150–400)
RBC: 3.43 MIL/uL — AB (ref 4.22–5.81)
RDW: 16.4 % — AB (ref 11.5–15.5)
WBC: 15.6 10*3/uL — ABNORMAL HIGH (ref 4.0–10.5)

## 2015-11-14 LAB — PROTIME-INR
INR: 3.81 — ABNORMAL HIGH (ref 0.00–1.49)
PROTHROMBIN TIME: 36.6 s — AB (ref 11.6–15.2)

## 2015-11-15 LAB — CULTURE, RESPIRATORY W GRAM STAIN

## 2015-11-15 LAB — PROTIME-INR
INR: 3.18 — AB (ref 0.00–1.49)
PROTHROMBIN TIME: 32 s — AB (ref 11.6–15.2)

## 2015-11-15 LAB — CULTURE, RESPIRATORY

## 2015-11-16 ENCOUNTER — Other Ambulatory Visit (HOSPITAL_COMMUNITY): Payer: Self-pay

## 2015-11-16 DIAGNOSIS — J441 Chronic obstructive pulmonary disease with (acute) exacerbation: Secondary | ICD-10-CM

## 2015-11-16 DIAGNOSIS — J9621 Acute and chronic respiratory failure with hypoxia: Secondary | ICD-10-CM

## 2015-11-16 DIAGNOSIS — R188 Other ascites: Secondary | ICD-10-CM

## 2015-11-16 LAB — PROTIME-INR
INR: 2.39 — ABNORMAL HIGH (ref 0.00–1.49)
Prothrombin Time: 25.8 seconds — ABNORMAL HIGH (ref 11.6–15.2)

## 2015-11-16 NOTE — Progress Notes (Signed)
PULMONARY / CRITICAL CARE MEDICINE   Name: Albert Fowler MRN: [id number] DOB: 10/11/1961    ADMISSION DATE:  10-01-15 CONSULTATION DATE:  11/03/15  [facility]  CHIEF COMPLAINT:  Right pneumothorax  INITIAL PRESENTATION: 54 y.o. 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  [facility] at [facility] revealed a R sided pneumothorax.  Chest tube placed by ED provider  [facility] has a h/o COPD, tobacco use, cirrhosis with varices, DVT of the upper extremity on Lovenox.  STUDIES:    SIGNIFICANT EVENTS: 11/8: admitted   SUBJECTIVE:  C/o discomfort, dyspnea.  Failed PS trial this am r/t tachypnea.   VITAL SIGNS:   BP 112/68  HR 114  RR 34 on full support, sats 96%   PHYSICAL EXAMINATION: General:  Chronically ill appearing patient with trach in place. Neuro:  Awake, anxious, Follows simple commands, nods  HEENT:  [place]/AT, temporal wasting, trach c/d  Cardiovascular:  RRR, no murmurs Lungs:  Right chest tube in place, resps even, mildly labored on full support, exp wheeze  Abdomen:  Distended abdomen, soft, PEG tube in place Musculoskeletal:  Cachetic, moves upper extremities independently, no edema, LE cool Skin:  Skin tears on b/l forearms/wrists  LABS:  CBC  Recent Labs Lab 11/12/15 0707 11/14/15 0538  WBC 12.4* 15.6*  HGB 9.1* 9.9*  HCT 29.7* 32.1*  PLT 150 151   Coag's  Recent Labs Lab 11/14/15 0538 11/15/15 0448 11/16/15 0548  INR 3.81* 3.18* 2.39*   BMET  Recent Labs Lab 11/14/15 0538  NA 146*  K 5.3*  CL 100*  CO2 36*  BUN 44*  CREATININE 1.13  GLUCOSE 136*   Electrolytes  Recent Labs Lab 11/14/15 0538  CALCIUM 8.9   Sepsis Markers No results for input(s): LATICACIDVEN, PROCALCITON, O2SATVEN in the last 168 hours. ABG No results for input(s): PHART, PCO2ART, PO2ART in the last 168 hours. Liver Enzymes No  results for input(s): AST, ALT, ALKPHOS, BILITOT, ALBUMIN in the last 168 hours. Cardiac Enzymes No results for input(s): TROPONINI, PROBNP in the last 168 hours. Glucose No results for input(s): GLUCAP in the last 168 hours.  Imaging No results found.   ASSESSMENT / PLAN:  R sided Pneumothorax, s/p chest tube - VDRF s/p trach - due to inability to wean from vent following hip replacement Sept 2016, severe COPD, ptx Tracheostomy dependence COPD by report - no PFT's in system Former smoker P:   F/u CXR now Continue weaning efforts-but he has noted no progress in the past and has been deemed vent dependent Continue outpatient Arformoterol, Singulair Intermittent paracentesis per IR to help with abd compliance  abx per primary  Anxiety/pain management     Dirk DressKaty Whiteheart, NP 11/16/2015  11:57 AM Pager: [phone] or [phone]   Attending:  I have seen and examined the patient with nurse practitioner/resident and agree with the note above.   Albert Fowler is not doing well He is wheezing significantly this morning on exam this morning, marked respiratory distress on the vent  Exam: Wheezing bilaterally, increased respiratory effort CV: tachy, regular, no MGR Belly distended, minimal bowel sounds Appears to be suffering  Chest tube> minimal respiratory variation  Acute on chronic respiratory failure > appears worse today than baseline, given no respiratory variation in chest tube will check a CXR COntinue brovana, pulmicort Change prednisone to higher dose solumedrol for  worsening wheezing/COPD  Ascites> worse today, making his breathing worse Agree with adding lasix  Overall prognosis seems grim, I updated his daughter at bedside today explaining that he is receiving minimal benefit from our efforts.   I agree that we should consider limiting care or moving towards comfort measures  My independent cc time 25 minutes  Heber Omar, MD [place] [facility] Pager:  [phone] Cell: [phone] After 3pm or if no response, call [phone]

## 2015-11-17 ENCOUNTER — Other Ambulatory Visit (HOSPITAL_COMMUNITY): Payer: Self-pay

## 2015-11-17 LAB — CBC
HEMATOCRIT: 28 % — AB (ref 39.0–52.0)
HEMOGLOBIN: 8.5 g/dL — AB (ref 13.0–17.0)
MCH: 28.7 pg (ref 26.0–34.0)
MCHC: 30.4 g/dL (ref 30.0–36.0)
MCV: 94.6 fL (ref 78.0–100.0)
Platelets: 197 10*3/uL (ref 150–400)
RBC: 2.96 MIL/uL — ABNORMAL LOW (ref 4.22–5.81)
RDW: 16.8 % — AB (ref 11.5–15.5)
WBC: 11.3 10*3/uL — AB (ref 4.0–10.5)

## 2015-11-17 LAB — CULTURE, BODY FLUID-BOTTLE: CULTURE: NO GROWTH

## 2015-11-17 LAB — CULTURE, BODY FLUID W GRAM STAIN -BOTTLE

## 2015-11-17 LAB — BASIC METABOLIC PANEL
ANION GAP: 10 (ref 5–15)
BUN: 81 mg/dL — ABNORMAL HIGH (ref 6–20)
CHLORIDE: 100 mmol/L — AB (ref 101–111)
CO2: 37 mmol/L — AB (ref 22–32)
Calcium: 9 mg/dL (ref 8.9–10.3)
Creatinine, Ser: 2.11 mg/dL — ABNORMAL HIGH (ref 0.61–1.24)
GFR calc Af Amer: 40 mL/min — ABNORMAL LOW (ref >60)
GFR calc non Af Amer: 34 mL/min — ABNORMAL LOW (ref >60)
GLUCOSE: 141 mg/dL — AB (ref 65–99)
POTASSIUM: 5.5 mmol/L — AB (ref 3.5–5.1)
Sodium: 147 mmol/L — ABNORMAL HIGH (ref 135–145)

## 2015-11-17 LAB — PROTIME-INR
INR: 1.87 — ABNORMAL HIGH (ref 0.00–1.49)
Prothrombin Time: 21.5 seconds — ABNORMAL HIGH (ref 11.6–15.2)

## 2015-11-17 MED ORDER — LIDOCAINE HCL (PF) 1 % IJ SOLN
INTRAMUSCULAR | Status: AC
  Filled 2015-11-17: qty 10

## 2015-11-17 NOTE — Procedures (Signed)
Successful US guided paracentesis from RLQ.  Yielded 6 liters of serous fluid.  No immediate complications.  Pt tolerated well.   Specimen was not sent for labs.  Pattricia BossMORGAN, Nour Rodrigues D PA-C 11/17/2015 2:11 PM

## 2015-11-18 ENCOUNTER — Other Ambulatory Visit (HOSPITAL_COMMUNITY): Payer: Self-pay

## 2015-11-18 LAB — PROTIME-INR
INR: 1.7 — AB (ref 0.00–1.49)
PROTHROMBIN TIME: 20 s — AB (ref 11.6–15.2)

## 2015-11-19 LAB — BASIC METABOLIC PANEL
Anion gap: 11 (ref 5–15)
BUN: 106 mg/dL — AB (ref 6–20)
CO2: 34 mmol/L — ABNORMAL HIGH (ref 22–32)
CREATININE: 1.63 mg/dL — AB (ref 0.61–1.24)
Calcium: 8.5 mg/dL — ABNORMAL LOW (ref 8.9–10.3)
Chloride: 108 mmol/L (ref 101–111)
GFR calc Af Amer: 54 mL/min — ABNORMAL LOW (ref >60)
GFR, EST NON AFRICAN AMERICAN: 47 mL/min — AB (ref >60)
GLUCOSE: 163 mg/dL — AB (ref 65–99)
Potassium: 4.5 mmol/L (ref 3.5–5.1)
SODIUM: 153 mmol/L — AB (ref 135–145)

## 2015-11-19 LAB — PROTIME-INR
INR: 2.12 — ABNORMAL HIGH (ref 0.00–1.49)
PROTHROMBIN TIME: 23.5 s — AB (ref 11.6–15.2)

## 2015-11-19 LAB — CBC
HCT: 34.8 % — ABNORMAL LOW (ref 39.0–52.0)
Hemoglobin: 10.1 g/dL — ABNORMAL LOW (ref 13.0–17.0)
MCH: 27.4 pg (ref 26.0–34.0)
MCHC: 29 g/dL — AB (ref 30.0–36.0)
MCV: 94.6 fL (ref 78.0–100.0)
PLATELETS: 167 10*3/uL (ref 150–400)
RBC: 3.68 MIL/uL — ABNORMAL LOW (ref 4.22–5.81)
RDW: 16.8 % — AB (ref 11.5–15.5)
WBC: 11.6 10*3/uL — ABNORMAL HIGH (ref 4.0–10.5)

## 2015-11-20 DIAGNOSIS — Z9911 Dependence on respirator [ventilator] status: Secondary | ICD-10-CM

## 2015-11-20 DIAGNOSIS — J9612 Chronic respiratory failure with hypercapnia: Secondary | ICD-10-CM

## 2015-11-20 LAB — PROTIME-INR
INR: 3.12 — ABNORMAL HIGH (ref 0.00–1.49)
Prothrombin Time: 31.5 seconds — ABNORMAL HIGH (ref 11.6–15.2)

## 2015-11-20 NOTE — Progress Notes (Signed)
PULMONARY / CRITICAL CARE MEDICINE   Name: Albert Fowler MRN: [id number] DOB: 02/16/1961    ADMISSION DATE:  11/15/2015 CONSULTATION DATE:  11/03/15  [facility]  CHIEF COMPLAINT:  Right pneumothorax  INITIAL PRESENTATION: 54 y.o. 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  [facility] after CXR at [facility] revealed a R sided pneumothorax.  Chest tube placed by ED provider  [facility] has a h/o COPD, tobacco use, cirrhosis with varices, DVT of the upper extremity on Lovenox.  STUDIES:    SIGNIFICANT EVENTS: 11/8: admitted   SUBJECTIVE:  C/o discomfort, dyspnea.  Failed PS trial this am r/t tachypnea.   VITAL SIGNS:   BP 112/68  HR 114  RR 34 on full support, sats 96%   PHYSICAL EXAMINATION: General:  Chronically ill appearing patient with trach in place Neuro:  Awake, anxious, Follows simple commands, nods, requests water HEENT:  [place], temporal wasting, cachectic, tra c/d ch Cardiovascular:  RRR, no murmurs/gallops/rubs Lungs:  Wheezing bilaterally, increased work of breathing  Abdomen:  Distended abdomen, BS+ Musculoskeletal:  Cachetic, moves upper extremities independently, no edema Skin:  Skin tears on b/l forearms/wrists well dressed  LABS:  CBC  Recent Labs Lab 11/14/15 0538 11/17/15 0507 11/19/15 0520  WBC 15.6* 11.3* 11.6*  HGB 9.9* 8.5* 10.1*  HCT 32.1* 28.0* 34.8*  PLT 151 197 167   Coag's  Recent Labs Lab 11/18/15 0552 11/19/15 0520 11/20/15 0453  INR 1.70* 2.12* 3.12*   BMET  Recent Labs Lab 11/14/15 0538 11/17/15 0507 11/19/15 0520  NA 146* 147* 153*  K 5.3* 5.5* 4.5  CL 100* 100* 108  CO2 36* 37* 34*  BUN 44* 81* 106*  CREATININE 1.13 2.11* 1.63*  GLUCOSE 136* 141* 163*   Electrolytes  Recent Labs Lab 11/14/15 0538 11/17/15 0507 11/19/15 0520  CALCIUM 8.9 9.0 8.5*   Sepsis Markers No results for  input(s): LATICACIDVEN, PROCALCITON, O2SATVEN in the last 168 hours. ABG No results for input(s): PHART, PCO2ART, PO2ART in the last 168 hours. Liver Enzymes No results for input(s): AST, ALT, ALKPHOS, BILITOT, ALBUMIN in the last 168 hours. Cardiac Enzymes No results for input(s): TROPONINI, PROBNP in the last 168 hours. Glucose No results for input(s): GLUCAP in the last 168 hours.  Imaging No results found.  11/23 CXR images reviewed> showing chest tube in place, emphysema  ASSESSMENT / PLAN:  R sided Pneumothorax, s/p chest tube -  Still has air leak 11/25 VDRF s/p trach - due to inability to wean from vent following hip replacement Sept 2016, severe COPD, ptx > overall prognosis poor, making slow progress Tracheostomy dependence COPD exacerbation> wheezing improved 11/25 Former smoker P:   Will stop solumedrol Add back prednisone 40mg  daily Continue weaning efforts> see my discussion below Continue outpatient Arformoterol, Singulair Will add pulmicort abx per primary  Anxiety/pain management   Overall his prognosis is poor, would move towards comfort measures.  I explained his poor prognosis to his daughter earlier this week.   Heber CarolinaBrent McQuaid, MD [place] [facility] Pager: [phone] Cell: [phone] After 3pm or if no response, call [phone]

## 2015-11-21 LAB — URINE MICROSCOPIC-ADD ON

## 2015-11-21 LAB — URINALYSIS, ROUTINE W REFLEX MICROSCOPIC
Bilirubin Urine: NEGATIVE
Glucose, UA: NEGATIVE mg/dL
KETONES UR: NEGATIVE mg/dL
NITRITE: NEGATIVE
PROTEIN: NEGATIVE mg/dL
Specific Gravity, Urine: 1.022 (ref 1.005–1.030)
pH: 5 (ref 5.0–8.0)

## 2015-11-21 LAB — CBC
HEMATOCRIT: 32.4 % — AB (ref 39.0–52.0)
Hemoglobin: 9.4 g/dL — ABNORMAL LOW (ref 13.0–17.0)
MCH: 27.2 pg (ref 26.0–34.0)
MCHC: 29 g/dL — ABNORMAL LOW (ref 30.0–36.0)
MCV: 93.9 fL (ref 78.0–100.0)
PLATELETS: 177 10*3/uL (ref 150–400)
RBC: 3.45 MIL/uL — ABNORMAL LOW (ref 4.22–5.81)
RDW: 17.1 % — AB (ref 11.5–15.5)
WBC: 19.5 10*3/uL — AB (ref 4.0–10.5)

## 2015-11-21 LAB — BASIC METABOLIC PANEL
Anion gap: 11 (ref 5–15)
CO2: 35 mmol/L — ABNORMAL HIGH (ref 22–32)
CREATININE: 2.48 mg/dL — AB (ref 0.61–1.24)
Calcium: 8.4 mg/dL — ABNORMAL LOW (ref 8.9–10.3)
Chloride: 106 mmol/L (ref 101–111)
GFR, EST AFRICAN AMERICAN: 32 mL/min — AB (ref >60)
GFR, EST NON AFRICAN AMERICAN: 28 mL/min — AB (ref >60)
Glucose, Bld: 178 mg/dL — ABNORMAL HIGH (ref 65–99)
Potassium: 4.6 mmol/L (ref 3.5–5.1)
SODIUM: 152 mmol/L — AB (ref 135–145)

## 2015-11-21 LAB — PROTIME-INR
INR: 3.2 — ABNORMAL HIGH (ref 0.00–1.49)
PROTHROMBIN TIME: 32.1 s — AB (ref 11.6–15.2)

## 2015-11-22 ENCOUNTER — Other Ambulatory Visit (HOSPITAL_COMMUNITY): Payer: Self-pay

## 2015-11-22 LAB — PROTIME-INR
INR: 3.1 — AB (ref 0.00–1.49)
Prothrombin Time: 31.4 seconds — ABNORMAL HIGH (ref 11.6–15.2)

## 2015-11-22 LAB — CBC WITH DIFFERENTIAL/PLATELET
Basophils Absolute: 0 10*3/uL (ref 0.0–0.1)
Basophils Relative: 0 %
EOS ABS: 0 10*3/uL (ref 0.0–0.7)
EOS PCT: 0 %
HCT: 30.9 % — ABNORMAL LOW (ref 39.0–52.0)
HEMOGLOBIN: 9.3 g/dL — AB (ref 13.0–17.0)
LYMPHS PCT: 4 %
Lymphs Abs: 0.9 10*3/uL (ref 0.7–4.0)
MCH: 28.1 pg (ref 26.0–34.0)
MCHC: 30.1 g/dL (ref 30.0–36.0)
MCV: 93.4 fL (ref 78.0–100.0)
MONO ABS: 2.1 10*3/uL — AB (ref 0.1–1.0)
MONOS PCT: 10 %
NEUTROS ABS: 17.4 10*3/uL — AB (ref 1.7–7.7)
Neutrophils Relative %: 86 %
Platelets: 172 10*3/uL (ref 150–400)
RBC: 3.31 MIL/uL — ABNORMAL LOW (ref 4.22–5.81)
RDW: 17.2 % — AB (ref 11.5–15.5)
WBC: 20.4 10*3/uL — AB (ref 4.0–10.5)

## 2015-11-22 LAB — RENAL FUNCTION PANEL
ANION GAP: 12 (ref 5–15)
Albumin: 1.7 g/dL — ABNORMAL LOW (ref 3.5–5.0)
BUN: 192 mg/dL — ABNORMAL HIGH (ref 6–20)
CHLORIDE: 102 mmol/L (ref 101–111)
CO2: 35 mmol/L — AB (ref 22–32)
CREATININE: 2.93 mg/dL — AB (ref 0.61–1.24)
Calcium: 8.3 mg/dL — ABNORMAL LOW (ref 8.9–10.3)
GFR calc non Af Amer: 23 mL/min — ABNORMAL LOW (ref >60)
GFR, EST AFRICAN AMERICAN: 27 mL/min — AB (ref >60)
GLUCOSE: 167 mg/dL — AB (ref 65–99)
Phosphorus: 5.1 mg/dL — ABNORMAL HIGH (ref 2.5–4.6)
Potassium: 5.8 mmol/L — ABNORMAL HIGH (ref 3.5–5.1)
Sodium: 149 mmol/L — ABNORMAL HIGH (ref 135–145)

## 2015-11-22 LAB — PROCALCITONIN: PROCALCITONIN: 1.63 ng/mL

## 2015-11-23 DIAGNOSIS — J810 Acute pulmonary edema: Secondary | ICD-10-CM

## 2015-11-23 DIAGNOSIS — Z930 Tracheostomy status: Secondary | ICD-10-CM

## 2015-11-23 DIAGNOSIS — J9601 Acute respiratory failure with hypoxia: Secondary | ICD-10-CM

## 2015-11-23 LAB — RENAL FUNCTION PANEL
ANION GAP: 14 (ref 5–15)
Albumin: 2.4 g/dL — ABNORMAL LOW (ref 3.5–5.0)
BUN: 198 mg/dL — ABNORMAL HIGH (ref 6–20)
CHLORIDE: 98 mmol/L — AB (ref 101–111)
CO2: 32 mmol/L (ref 22–32)
CREATININE: 3.35 mg/dL — AB (ref 0.61–1.24)
Calcium: 8.1 mg/dL — ABNORMAL LOW (ref 8.9–10.3)
GFR calc non Af Amer: 19 mL/min — ABNORMAL LOW (ref >60)
GFR, EST AFRICAN AMERICAN: 23 mL/min — AB (ref >60)
GLUCOSE: 142 mg/dL — AB (ref 65–99)
Phosphorus: 6 mg/dL — ABNORMAL HIGH (ref 2.5–4.6)
Potassium: 5.7 mmol/L — ABNORMAL HIGH (ref 3.5–5.1)
Sodium: 144 mmol/L (ref 135–145)

## 2015-11-23 LAB — AMMONIA: Ammonia: 32 umol/L (ref 9–35)

## 2015-11-23 LAB — CBC WITH DIFFERENTIAL/PLATELET
BASOS PCT: 0 %
Basophils Absolute: 0 10*3/uL (ref 0.0–0.1)
EOS PCT: 0 %
Eosinophils Absolute: 0 10*3/uL (ref 0.0–0.7)
HEMATOCRIT: 22 % — AB (ref 39.0–52.0)
HEMOGLOBIN: 6.7 g/dL — AB (ref 13.0–17.0)
Lymphocytes Relative: 3 %
Lymphs Abs: 0.5 10*3/uL — ABNORMAL LOW (ref 0.7–4.0)
MCH: 28 pg (ref 26.0–34.0)
MCHC: 30.5 g/dL (ref 30.0–36.0)
MCV: 92.1 fL (ref 78.0–100.0)
MONOS PCT: 9 %
Monocytes Absolute: 1.6 10*3/uL — ABNORMAL HIGH (ref 0.1–1.0)
NEUTROS PCT: 88 %
Neutro Abs: 15.3 10*3/uL — ABNORMAL HIGH (ref 1.7–7.7)
Platelets: 125 10*3/uL — ABNORMAL LOW (ref 150–400)
RBC: 2.39 MIL/uL — AB (ref 4.22–5.81)
RDW: 17.5 % — ABNORMAL HIGH (ref 11.5–15.5)
WBC: 17.4 10*3/uL — AB (ref 4.0–10.5)

## 2015-11-23 LAB — URINE CULTURE: Culture: 7000

## 2015-11-23 LAB — C DIFFICILE QUICK SCREEN W PCR REFLEX
C DIFFICILE (CDIFF) INTERP: NEGATIVE
C DIFFICILE (CDIFF) TOXIN: NEGATIVE
C DIFFICLE (CDIFF) ANTIGEN: NEGATIVE

## 2015-11-23 LAB — ABO/RH: ABO/RH(D): O POS

## 2015-11-23 LAB — PROTIME-INR
INR: 2.49 — AB (ref 0.00–1.49)
Prothrombin Time: 26.6 seconds — ABNORMAL HIGH (ref 11.6–15.2)

## 2015-11-23 LAB — PREPARE RBC (CROSSMATCH)

## 2015-11-23 NOTE — Progress Notes (Signed)
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  Brought to [facility] 11/7 after CXR at [facility] revealed a R sided pneumothorax.  Chest tube placed by ED provider.  [facility] Hx of COPD, tobacco use, cirrhosis with varices, DVT of the upper extremity on Lovenox.  STUDIES:    SIGNIFICANT EVENTS: 11/08  admitted to [facility] with R pneumothorax 11/10  Tx to [facility]   SUBJECTIVE: RT reports patient failed SBT - low lung volumes, tachypnea.  Not tolerating weaning in last 2-3 days.  Remains on PCV 30, PEEP 5, R 30, 40% FiO2.  PENDING paracentesis 11/28 per IR  VITAL SIGNS:  99/58, 104, 16, 97.8, 99%    PHYSICAL EXAMINATION: General:  Chronically ill appearing, NAD but appears uncomfortable Neuro:  Awake, anxious, Follows simple commands HEENT:  NCAT, temporal wasting, cachectic, trach c/d/i Cardiovascular:  RRR, no murmurs/gallops/rubs Lungs:  Non-labored, lungs bilaterally diminished Abdomen:  Distended / firm, abdomen, BS active Musculoskeletal:  Cachetic, moves upper extremities independently, no edema Skin:  Skin tears on b/l forearms/wrists  LABS:  CBC  Recent Labs Lab 11/21/15 0740 11/22/15 0436 11/23/15 0805  WBC 19.5* 20.4* 17.4*  HGB 9.4* 9.3* 6.7*  HCT 32.4* 30.9* 22.0*  PLT 177 172 125*   Coag's  Recent Labs Lab 11/21/15 0740 11/22/15 0436 11/23/15 0700  INR 3.20* 3.10* 2.49*   BMET  Recent Labs Lab 11/21/15 0740 11/22/15 0436 11/23/15 0700  NA 152* 149* 144  K 4.6 5.8* 5.7*  CL 106 102 98*  CO2 35* 35* 32  BUN >150* 192* 198*  CREATININE 2.48* 2.93* 3.35*  GLUCOSE 178* 167* 142*   Electrolytes  Recent Labs Lab  11/21/15 0740 11/22/15 0436 11/23/15 0700  CALCIUM 8.4* 8.3* 8.1*  PHOS  --  5.1* 6.0*   Sepsis Markers  Recent Labs Lab 11/22/15 0436  PROCALCITON 1.63   ABG No results for input(s): PHART, PCO2ART, PO2ART in the last 168 hours.   Liver Enzymes  Recent Labs Lab 11/22/15 0436 11/23/15 0700  ALBUMIN 1.7* 2.4*   Cardiac Enzymes No results for input(s): TROPONINI, PROBNP in the last 168 hours.   Glucose No results for input(s): GLUCAP in the last 168 hours.  Imaging Dg Chest Port 1 View  11/22/2015  CLINICAL DATA:  Peripherally inserted central catheter. EXAM: PORTABLE CHEST - 1 VIEW COMPARISON:  One-view chest x-ray from the same day. FINDINGS: The heart size is normal. A new left-sided PICC line is in place. The tip is at the cavoatrial junction. Endotracheal tube in right-sided chest tubes are stable in position. Minimal pleural air is present on the right. Asymmetric interstitial and airspace disease is again noted on the right. IMPRESSION: 1. Interval placement of left-sided PICC line. The tip is at the cavoatrial junction. 2. Stable right-sided chest tube.  Minimal pleural air is present. 3. Stable asymmetric interstitial and airspace disease on the right. Electronically Signed   By: Marin Roberts M.D.   On: 11/22/2015 20:10    11/27 CXR images reviewed > L sided PICC, stable R CT, stable airspace disease on R   ASSESSMENT / PLAN:  R sided Pneumothorax, s/p chest tube -  Still  has air leak 11/25 VDRF s/p trach - due to inability to wean from vent following hip replacement Sept 2016, severe COPD, ptx > overall prognosis poor, making slow progress Tracheostomy dependence COPD exacerbation > wheezing resolved 11/28.  Former smoker P:   Prednisone 40mg  daily, slow taper Continue weaning efforts > but doubt he will make progress Continue outpatient Arformoterol, Singulair Intermittent CXR while CT in place.  Follow up 12/1 Continue pulmicort ABX per primary   Anxiety/pain management  CT care per protocol  Paracentesis pending 11/28 per IR   Overall his prognosis is poor, would move towards comfort measures.     Canary BrimBrandi Ollis, NP-C [facility] Pgr: [phone] or if no answer [phone] 11/23/2015, 11:28 AM  Attending Note:  54 year old male with extensive PMH who presented with a  PTX.  Chest tube was placed and PTX improved.  However, due to his advanced COPD he was not able to wean and a tracheostomy was placed.  He is currently failing weaning due to ascites, fluid overload and overall deconditioning.  He is to go for another paracentesis today.  Crackles audible on exam.  I reviewed CXR myself, tracheostomy in good position, low volumes and pulmonary edema noted.  Discussed with SSH-MD and RT.  Respiratory failure: due to above reasons.  - Highly doubt he will wean given his extensive medical history.  - If family wishes for continued support will need a vent SNF.  - Continue to push weaning as able.  COPD:  - Prednisone 40 mg, begin to taper.  - Continue outpatient Arformoterol, Singulair  - Titrate O2 down for sat of 88-92%.  - Continue pulmicort  PTX:  - Continue chest tube as long as positive pressure is needed.  - This will be a large issue for disposition.  Disposition: will be very difficult.  I do not foresee patient weaning off the vent.  Suspect his quality of life will be very poor.  Will need EOL discussion with family.  Patient seen and examined, agree with above note.  I dictated the care and orders written for this patient under my direction.  Alyson ReedyWesam G Yacoub, MD [phone]

## 2015-11-24 ENCOUNTER — Other Ambulatory Visit (HOSPITAL_COMMUNITY): Payer: Self-pay

## 2015-11-24 LAB — CULTURE, RESPIRATORY W GRAM STAIN

## 2015-11-24 LAB — RENAL FUNCTION PANEL
Albumin: 2.8 g/dL — ABNORMAL LOW (ref 3.5–5.0)
Anion gap: 11 (ref 5–15)
BUN: 215 mg/dL — AB (ref 6–20)
CO2: 34 mmol/L — ABNORMAL HIGH (ref 22–32)
CREATININE: 3.56 mg/dL — AB (ref 0.61–1.24)
Calcium: 8.1 mg/dL — ABNORMAL LOW (ref 8.9–10.3)
Chloride: 98 mmol/L — ABNORMAL LOW (ref 101–111)
GFR calc non Af Amer: 18 mL/min — ABNORMAL LOW (ref >60)
GFR, EST AFRICAN AMERICAN: 21 mL/min — AB (ref >60)
Glucose, Bld: 166 mg/dL — ABNORMAL HIGH (ref 65–99)
PHOSPHORUS: 8.4 mg/dL — AB (ref 2.5–4.6)
Potassium: 6.5 mmol/L (ref 3.5–5.1)
Sodium: 143 mmol/L (ref 135–145)

## 2015-11-24 LAB — GRAM STAIN

## 2015-11-24 LAB — CBC WITH DIFFERENTIAL/PLATELET
Basophils Absolute: 0.1 10*3/uL (ref 0.0–0.1)
Basophils Relative: 0 %
EOS ABS: 0 10*3/uL (ref 0.0–0.7)
EOS PCT: 0 %
HCT: 24.5 % — ABNORMAL LOW (ref 39.0–52.0)
HEMOGLOBIN: 7.4 g/dL — AB (ref 13.0–17.0)
LYMPHS ABS: 0.5 10*3/uL — AB (ref 0.7–4.0)
Lymphocytes Relative: 3 %
MCH: 27.7 pg (ref 26.0–34.0)
MCHC: 30.2 g/dL (ref 30.0–36.0)
MCV: 91.8 fL (ref 78.0–100.0)
MONOS PCT: 10 %
Monocytes Absolute: 1.8 10*3/uL — ABNORMAL HIGH (ref 0.1–1.0)
Neutro Abs: 15 10*3/uL — ABNORMAL HIGH (ref 1.7–7.7)
Neutrophils Relative %: 87 %
PLATELETS: 144 10*3/uL — AB (ref 150–400)
RBC: 2.67 MIL/uL — ABNORMAL LOW (ref 4.22–5.81)
RDW: 18.7 % — ABNORMAL HIGH (ref 11.5–15.5)
WBC: 17.3 10*3/uL — ABNORMAL HIGH (ref 4.0–10.5)

## 2015-11-24 LAB — PROTIME-INR
INR: 1.81 — ABNORMAL HIGH (ref 0.00–1.49)
Prothrombin Time: 20.9 seconds — ABNORMAL HIGH (ref 11.6–15.2)

## 2015-11-24 LAB — CULTURE, RESPIRATORY

## 2015-11-24 LAB — PROCALCITONIN: PROCALCITONIN: 1.25 ng/mL

## 2015-11-24 LAB — VANCOMYCIN, TROUGH: VANCOMYCIN TR: 15 ug/mL (ref 10.0–20.0)

## 2015-11-24 MED ORDER — LIDOCAINE HCL (PF) 1 % IJ SOLN
INTRAMUSCULAR | Status: AC
  Filled 2015-11-24: qty 10

## 2015-11-24 NOTE — Procedures (Signed)
Successful US guided paracentesis from RLQ.  Yielded 4.7 liters of serous fluid.  No immediate complications.  Pt tolerated well.   Specimen was sent for labs.  Pattricia BossMORGAN, Skylin Kennerson D PA-C 11/24/2015 1:34 PM

## 2015-11-25 ENCOUNTER — Other Ambulatory Visit (HOSPITAL_COMMUNITY): Payer: Self-pay

## 2015-11-25 LAB — RENAL FUNCTION PANEL
ANION GAP: 13 (ref 5–15)
Albumin: 3.1 g/dL — ABNORMAL LOW (ref 3.5–5.0)
BUN: 217 mg/dL — ABNORMAL HIGH (ref 6–20)
CHLORIDE: 98 mmol/L — AB (ref 101–111)
CO2: 31 mmol/L (ref 22–32)
Calcium: 8 mg/dL — ABNORMAL LOW (ref 8.9–10.3)
Creatinine, Ser: 3.43 mg/dL — ABNORMAL HIGH (ref 0.61–1.24)
GFR calc non Af Amer: 19 mL/min — ABNORMAL LOW (ref >60)
GFR, EST AFRICAN AMERICAN: 22 mL/min — AB (ref >60)
Glucose, Bld: 140 mg/dL — ABNORMAL HIGH (ref 65–99)
Phosphorus: 7 mg/dL — ABNORMAL HIGH (ref 2.5–4.6)
Potassium: 5.4 mmol/L — ABNORMAL HIGH (ref 3.5–5.1)
Sodium: 142 mmol/L (ref 135–145)

## 2015-11-25 LAB — CBC WITH DIFFERENTIAL/PLATELET
BASOS PCT: 0 %
Basophils Absolute: 0 10*3/uL (ref 0.0–0.1)
Eosinophils Absolute: 0 10*3/uL (ref 0.0–0.7)
Eosinophils Relative: 0 %
HEMATOCRIT: 22.1 % — AB (ref 39.0–52.0)
HEMOGLOBIN: 6.7 g/dL — AB (ref 13.0–17.0)
Lymphocytes Relative: 5 %
Lymphs Abs: 0.7 10*3/uL (ref 0.7–4.0)
MCH: 27.6 pg (ref 26.0–34.0)
MCHC: 30.3 g/dL (ref 30.0–36.0)
MCV: 90.9 fL (ref 78.0–100.0)
Monocytes Absolute: 0.9 10*3/uL (ref 0.1–1.0)
Monocytes Relative: 6 %
NEUTROS PCT: 89 %
Neutro Abs: 13.1 10*3/uL — ABNORMAL HIGH (ref 1.7–7.7)
Platelets: 91 10*3/uL — ABNORMAL LOW (ref 150–400)
RBC: 2.43 MIL/uL — ABNORMAL LOW (ref 4.22–5.81)
RDW: 18.4 % — ABNORMAL HIGH (ref 11.5–15.5)
WBC: 14.7 10*3/uL — ABNORMAL HIGH (ref 4.0–10.5)

## 2015-11-25 LAB — PROTIME-INR
INR: 1.41 (ref 0.00–1.49)
PROTHROMBIN TIME: 17.4 s — AB (ref 11.6–15.2)

## 2015-11-25 LAB — PREPARE RBC (CROSSMATCH)

## 2015-11-26 ENCOUNTER — Other Ambulatory Visit (HOSPITAL_COMMUNITY): Payer: Medicare Other

## 2015-11-26 DIAGNOSIS — G9340 Encephalopathy, unspecified: Secondary | ICD-10-CM

## 2015-11-26 DIAGNOSIS — Z515 Encounter for palliative care: Secondary | ICD-10-CM | POA: Insufficient documentation

## 2015-11-26 DIAGNOSIS — J9601 Acute respiratory failure with hypoxia: Secondary | ICD-10-CM | POA: Insufficient documentation

## 2015-11-26 LAB — RENAL FUNCTION PANEL
Albumin: 3 g/dL — ABNORMAL LOW (ref 3.5–5.0)
Anion gap: 13 (ref 5–15)
BUN: 233 mg/dL — AB (ref 6–20)
CO2: 30 mmol/L (ref 22–32)
Calcium: 8.3 mg/dL — ABNORMAL LOW (ref 8.9–10.3)
Chloride: 98 mmol/L — ABNORMAL LOW (ref 101–111)
Creatinine, Ser: 3.12 mg/dL — ABNORMAL HIGH (ref 0.61–1.24)
GFR calc Af Amer: 25 mL/min — ABNORMAL LOW (ref >60)
GFR calc non Af Amer: 21 mL/min — ABNORMAL LOW (ref >60)
GLUCOSE: 157 mg/dL — AB (ref 65–99)
PHOSPHORUS: 7.4 mg/dL — AB (ref 2.5–4.6)
POTASSIUM: 5.8 mmol/L — AB (ref 3.5–5.1)
Sodium: 141 mmol/L (ref 135–145)

## 2015-11-26 LAB — CBC
HEMATOCRIT: 32.6 % — AB (ref 39.0–52.0)
Hemoglobin: 10.4 g/dL — ABNORMAL LOW (ref 13.0–17.0)
MCH: 29.3 pg (ref 26.0–34.0)
MCHC: 31.9 g/dL (ref 30.0–36.0)
MCV: 91.8 fL (ref 78.0–100.0)
Platelets: 82 10*3/uL — ABNORMAL LOW (ref 150–400)
RBC: 3.55 MIL/uL — ABNORMAL LOW (ref 4.22–5.81)
RDW: 16.9 % — AB (ref 11.5–15.5)
WBC: 23 10*3/uL — ABNORMAL HIGH (ref 4.0–10.5)

## 2015-11-26 LAB — TYPE AND SCREEN
ABO/RH(D): O POS
ANTIBODY SCREEN: NEGATIVE
UNIT DIVISION: 0
UNIT DIVISION: 0
UNIT DIVISION: 0

## 2015-11-26 LAB — PROTIME-INR
INR: 1.19 (ref 0.00–1.49)
PROTHROMBIN TIME: 15.3 s — AB (ref 11.6–15.2)

## 2015-11-26 LAB — BASIC METABOLIC PANEL
Anion gap: 14 (ref 5–15)
BUN: 232 mg/dL — ABNORMAL HIGH (ref 6–20)
CALCIUM: 8.2 mg/dL — AB (ref 8.9–10.3)
CO2: 29 mmol/L (ref 22–32)
CREATININE: 3.13 mg/dL — AB (ref 0.61–1.24)
Chloride: 98 mmol/L — ABNORMAL LOW (ref 101–111)
GFR calc non Af Amer: 21 mL/min — ABNORMAL LOW (ref >60)
GFR, EST AFRICAN AMERICAN: 25 mL/min — AB (ref >60)
GLUCOSE: 154 mg/dL — AB (ref 65–99)
Potassium: 5.7 mmol/L — ABNORMAL HIGH (ref 3.5–5.1)
Sodium: 141 mmol/L (ref 135–145)

## 2015-11-26 NOTE — Progress Notes (Signed)
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  [facility] after CXR at [facility] revealed a R sided pneumothorax.  Chest tube placed by ED provider.  [facility] Hx of COPD, tobacco use, cirrhosis with varices, DVT of the upper extremity on Lovenox.  STUDIES:    SIGNIFICANT EVENTS: 11/08  [facility]  [facility]   SUBJECTIVE: RT reports patient failed SBT - low lung volumes, tachypnea.  Not tolerating weaning in last 2-3 days.  Remains on PCV 30, PEEP 5, R 30, 40% FiO2.  PENDING paracentesis 11/28 per IR  VITAL SIGNS:  99/58, 104, 16, 97.8, 99%    PHYSICAL EXAMINATION: General:  Chronically ill appearing, NAD but appears uncomfortable Neuro:  Awake, anxious, Follows simple commands HEENT:  NCAT, temporal wasting, cachectic, trach c/d/i Cardiovascular:  RRR, no murmurs/gallops/rubs Lungs:  Non-labored, lungs bilaterally diminished Abdomen:  Distended / firm, abdomen, BS active Musculoskeletal:  Cachetic, moves upper extremities independently, no edema Skin:  Skin tears on b/l forearms/wrists  LABS:  CBC  Recent Labs Lab 11/24/15 0500 11/25/15 0650 11/26/15 0750  WBC 17.3* 14.7* 23.0*  HGB 7.4* 6.7* 10.4*  HCT 24.5* 22.1* 32.6*  PLT 144* 91* 82*   Coag's  Recent Labs Lab 11/24/15 0500 11/25/15 0650 11/26/15 0750  INR 1.81* 1.41 1.19   BMET  Recent Labs Lab 11/24/15 0500 11/25/15 0650 11/26/15 0750  NA 143 142 141  141  K 6.5* 5.4* 5.7*  5.8*  CL 98* 98* 98*  98*  CO2 34* 31 29  30   BUN 215* 217* 232*  233*  CREATININE 3.56* 3.43* 3.13*  3.12*  GLUCOSE 166* 140* 154*  157*    Electrolytes  Recent Labs Lab 11/24/15 0500 11/25/15 0650 11/26/15 0750  CALCIUM 8.1* 8.0* 8.2*  8.3*  PHOS 8.4* 7.0* 7.4*   Sepsis Markers  Recent Labs Lab 11/22/15 0436 11/24/15 0500  PROCALCITON 1.63 1.25   ABG No results for input(s): PHART, PCO2ART, PO2ART in the last 168 hours.   Liver Enzymes  Recent Labs Lab 11/24/15 0500 11/25/15 0650 11/26/15 0750  ALBUMIN 2.8* 3.1* 3.0*   Cardiac Enzymes No results for input(s): TROPONINI, PROBNP in the last 168 hours.   Glucose No results for input(s): GLUCAP in the last 168 hours.  Imaging Dg Chest Port 1 View  11/26/2015  CLINICAL DATA:  COPD, right-sided pneumothorax with chest tube treatment, acute on chronic respiratory failure, hepatic cirrhosis EXAM: PORTABLE CHEST 1 VIEW COMPARISON:  Portable chest x-ray of November 25, 2015 FINDINGS: Positioning on today's study is limited. The lungs are reasonably well inflated. The interstitial markings remain increased but are stable. The right-sided chest tube is unchanged with its tip overlying the posterior aspect of the third rib. No pneumothorax is evident. The tracheostomy tube tip projects at the level of the clavicular heads. The left subclavian venous catheter tip projects over the distal third of the SVC. IMPRESSION: Stable appearance of the chest with bilateral interstitial infiltrates. No right-sided pneumothorax is observed. The support tubes are in reasonable position. Electronically Signed   By: David  Swazil[place]Jordan M.D.   On: 11/26/2015 07:28  11/27 CXR images reviewed > L sided PICC, stable R CT, stable airspace disease on R   ASSESSMENT / PLAN:  R sided Pneumothorax, s/p chest tube -  Still has air leak 11/25 VDRF s/p trach - due to inability to wean from vent following hip replacement Sept 2016, severe COPD, ptx > overall prognosis poor, making slow progress Tracheostomy dependence COPD exacerbation > wheezing resolved 11/28.  Former smoker P:   Prednisone   daily, slow taper Continue weaning efforts > but doubt he will make progress Continue outpatient Arformoterol, Singulair Intermittent CXR while CT in place.  Follow up 12/1 Continue pulmicort ABX per primary  Anxiety/pain management  CT care per protocol  Paracentesis pending 11/35 per IR  54 year old male with extensive PMH who presented with a  PTX.  Chest tube was placed and PTX improved.  However, due to his advanced COPD he was not able to wean and a tracheostomy was placed.  He is currently failing weaning due to ascites, fluid overload and overall deconditioning.  He is to go for another paracentesis today.  Crackles audible on exam.  I reviewed CXR myself, tracheostomy in good position, low volumes and pulmonary edema noted.  Discussed with SSH-MD and RT.  Respiratory failure: due to above reasons.  - Highly doubt he will wean given his extensive medical history.  - If family wishes for continued support will need a vent SNF.  - Continue to push weaning as able.  COPD:  - Prednisone 40 mg, begin to taper.  - Continue outpatient Arformoterol, Singulair  - Titrate O2 down for sat of 88-92%.  - Continue pulmicort  PTX:  - Continue chest tube as long as positive pressure is needed.  - This will be a large issue for disposition.  Disposition: will be very difficult.  I do not foresee patient weaning off the vent.  Suspect his quality of life will be very poor.  Will need EOL discussion with family.  Daughter to arrive in today in the afternoon for EOL discussion, needs comfort care, will not improve.  Patient seen and examined, agree with above note.  I dictated the care and orders written for this patient under my direction.  Alyson Reedy, MD [phone]

## 2015-11-26 NOTE — Progress Notes (Signed)
Spoke with daughter at her request extensively.  After prolonged discussion, she understands that patient will never improve and will die and we would rather have him die comfortable rather than suffering.  She expressed understanding.  Will start morphine drip today and withdrawal of mechanical ventilator in AM when she is ready.  Discussed with SSH-MD and will put the morphine drip orders.  The patient is critically ill with multiple organ systems failure and requires high complexity decision making for assessment and support, frequent evaluation and titration of therapies, application of advanced monitoring technologies and extensive interpretation of multiple databases.   Critical Care Time devoted to patient care services described in this note is  35  Minutes. This time reflects time of care of this signee Dr Koren BoundWesam Yacoub. This critical care time does not reflect procedure time, or teaching time or supervisory time of PA/NP/Med student/Med Resident etc but could involve care discussion time.  Alyson ReedyWesam G. Yacoub, M.D. [facility]. Pager: [phone]. After hours pager: [phone].

## 2015-11-26 DEATH — deceased

## 2015-11-27 LAB — CULTURE, BLOOD (ROUTINE X 2): CULTURE: NO GROWTH

## 2015-11-29 LAB — CULTURE, BODY FLUID-BOTTLE

## 2015-11-29 LAB — CULTURE, BODY FLUID W GRAM STAIN -BOTTLE: Culture: NO GROWTH

## 2015-12-27 DEATH — deceased

## 2017-03-22 IMAGING — CT CT CHEST W/O CM
2 of 3 series · 12 of 36 positions shown, 15 images · non-contrast
Comparison: Chest x-ray 11/03/2015 and chest CT 03/03/2008

CLINICAL DATA: Evaluate chest tube as patient not doing well on
ventilator.

EXAM:
CT CHEST WITHOUT CONTRAST
TECHNIQUE: Multidetector CT imaging of the chest was performed following the
standard protocol without IV contrast.

[Series 201: chest without, idose (2) · axial · non-contrast · 0.68mm/px · z∈[-348,-53]mm · 9 of 71 slices shown, 12 images]
[im 6/71  mediastinal]
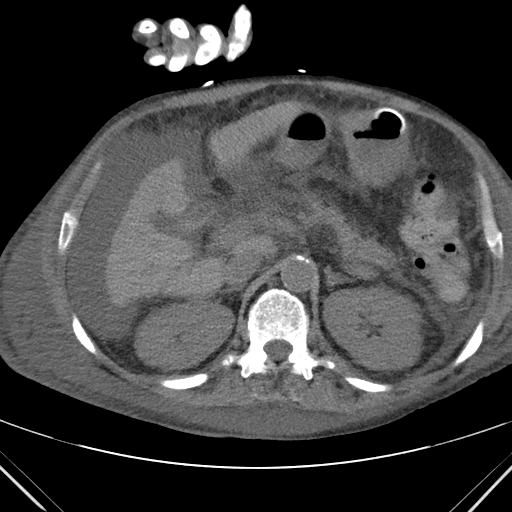
[im 6/71  lung]
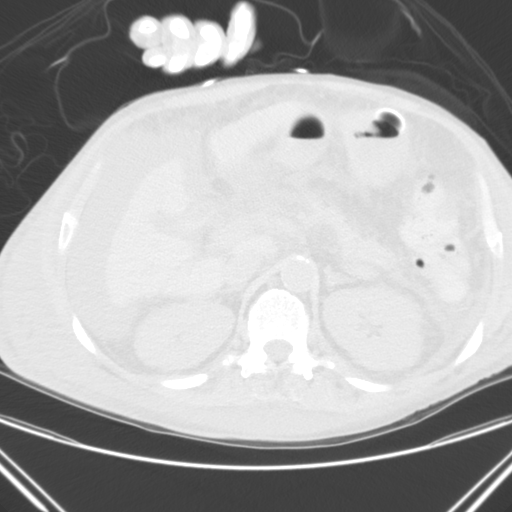
[im 13/71  lung]
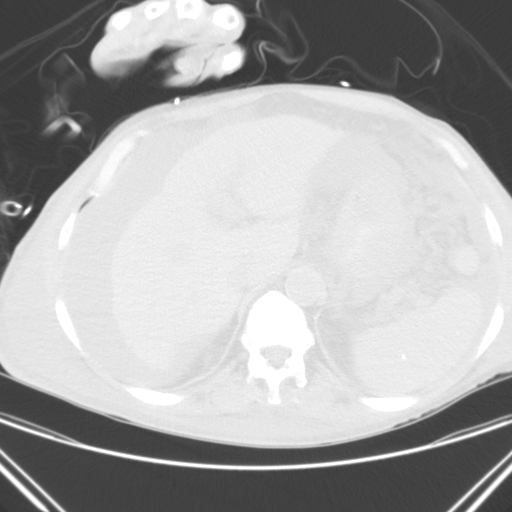
[im 21/71  lung]
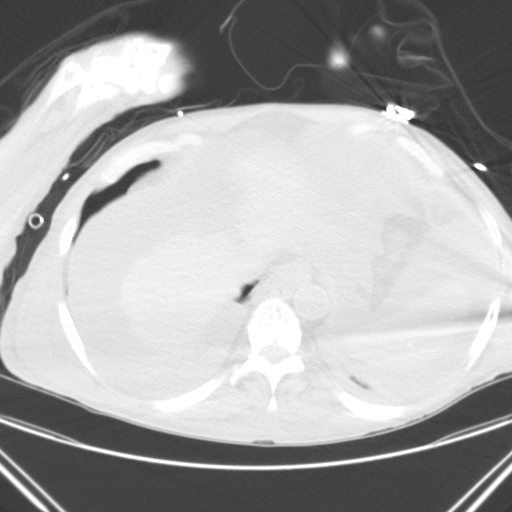
[im 29/71  lung]
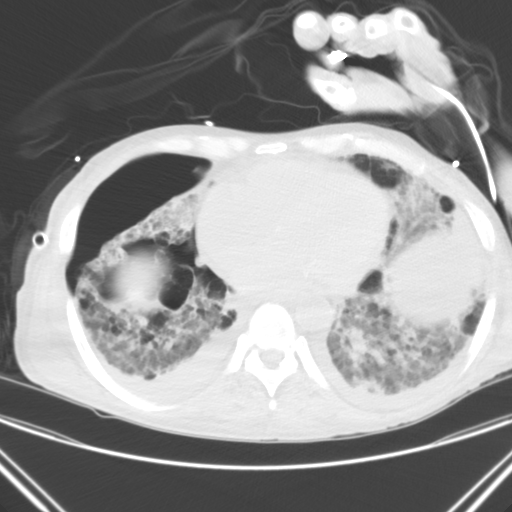
[im 37/71  mediastinal]
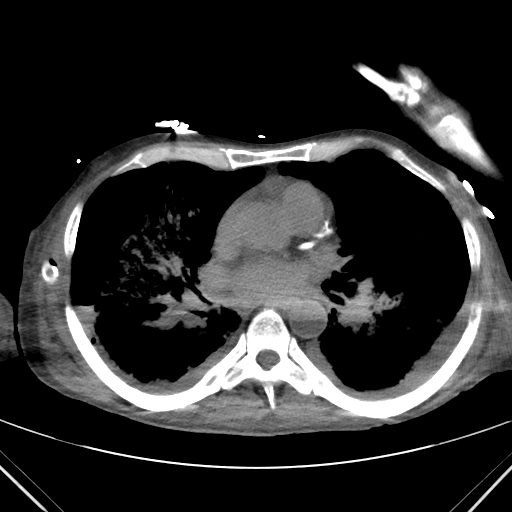
[im 37/71  lung]
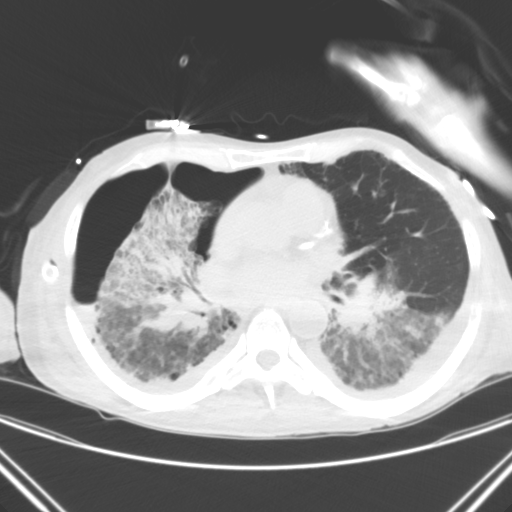
[im 42/71  lung]
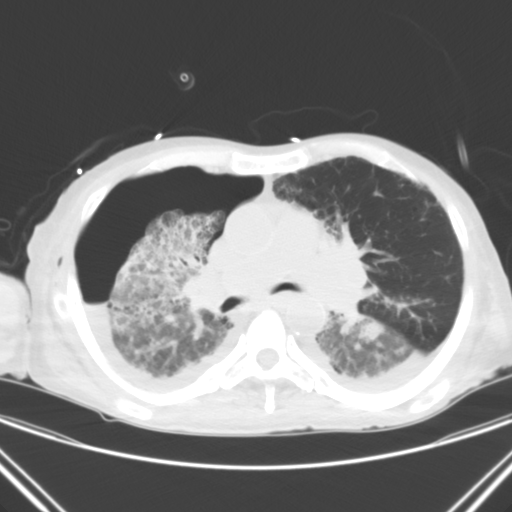
[im 50/71  lung]
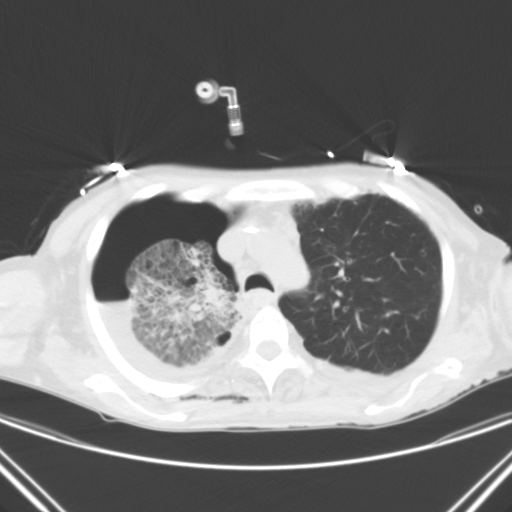
[im 58/71  lung]
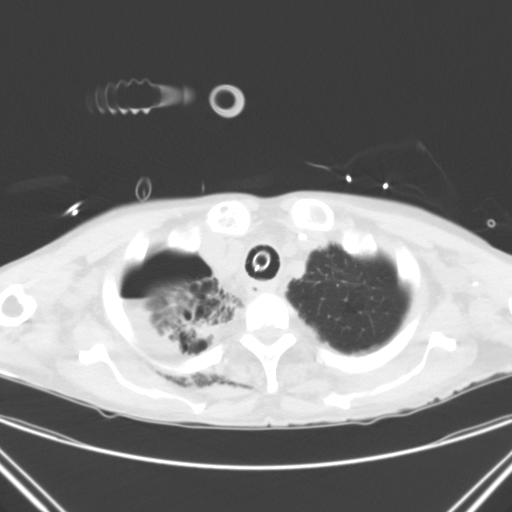
[im 65/71  mediastinal]
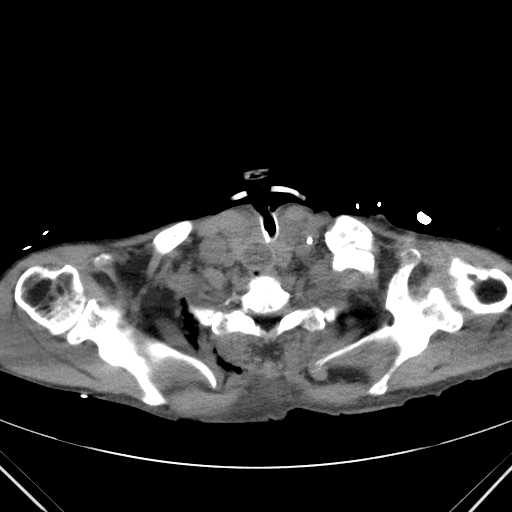
[im 65/71  lung]
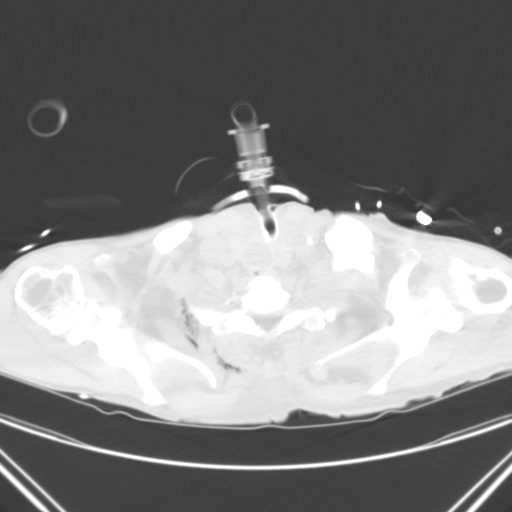

[Series 203: coronal, idose (2) · coronal · 0.45mm/px · 3 of 110 slices shown]
[im 22/110  lung]
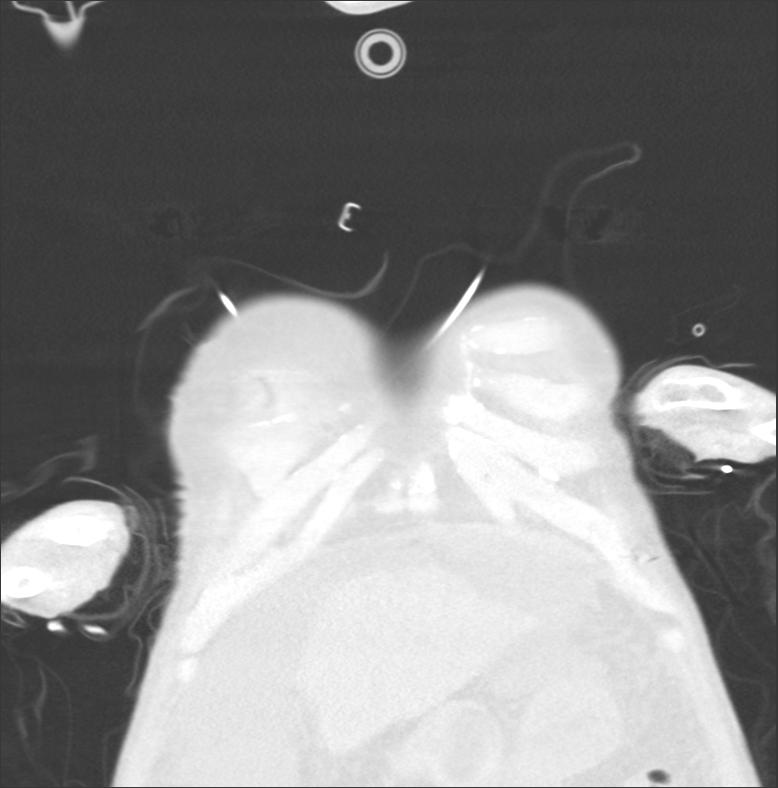
[im 44/110  lung]
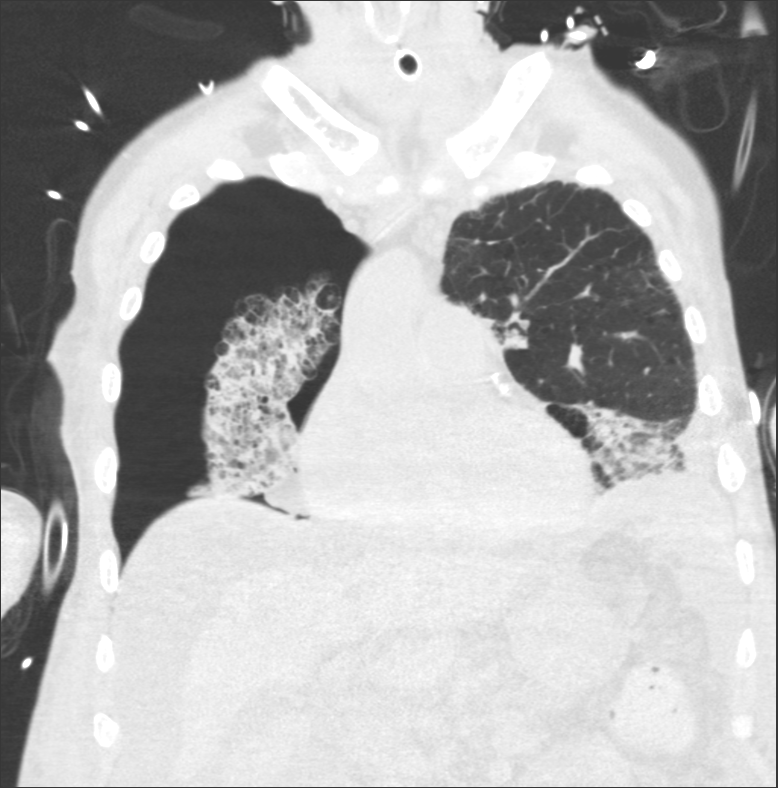
[im 66/110  lung]
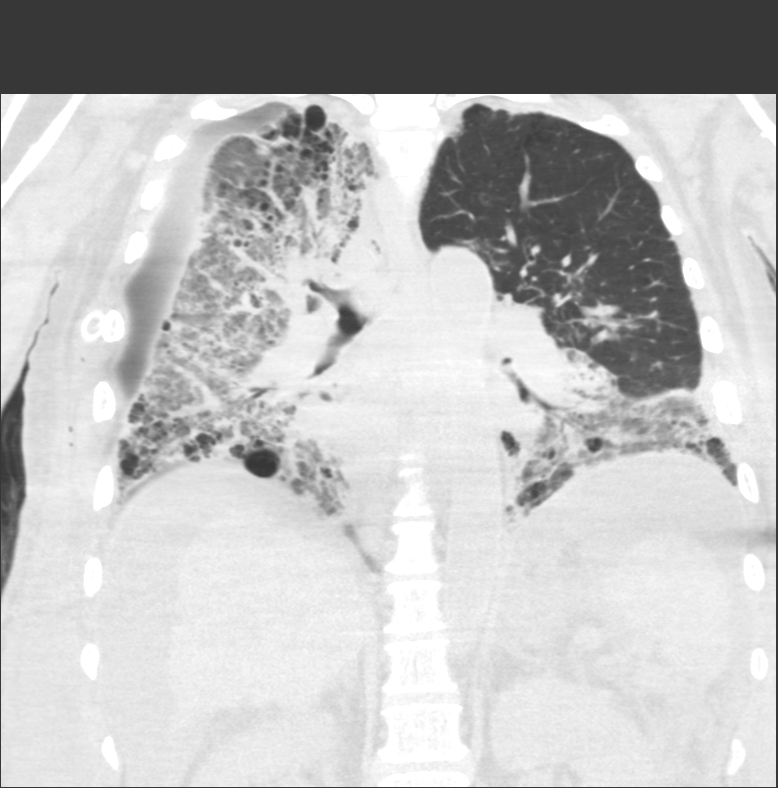

[12 of 36 positions shown; findings below may reference images not displayed]

FINDINGS: Tracheostomy tube is in adequate position. Left thigh IJ central
venous catheter has tip over the left brachycephalic vein just
proximal to the SVC. Right-sided chest tube courses from the lateral
right chest wall posteriorly, superficial to the bony thorax and
pleural cavity with tip within the paraspinal soft tissues at the
approximate T5 level.

There is evidence of patient's known moderate size right
pneumothorax. Small bilateral pleural effusions are present. There
is evidence of fibrotic change within the mid to lower lungs. There
is mixed interstitial airspace opacification over the lingula and
left lower lobe with suggestion of similar changes within the
collapsed right lung. Mild nodularity within the left upper lobe.
Findings may be due to an acute infectious versus inflammatory
process. Heart is normal size. Calcification and possible stent over
the left anterior descending coronary artery. Mild calcification of
the thoracic aorta. No significant mediastinal, hilar or axillary
adenopathy. Remaining mediastinal structures are within normal.

Images through the upper abdomen demonstrate moderate ascites.
Calcified splenic granulomas are present. Sludge versus small stones
within the gallbladder. Evidence of cirrhosis with moderate
nodularity to the liver contour. Percutaneous gastrostomy tube in
adequate position.
IMPRESSION: Known moderate size right pneumothorax. Note that the right-sided
chest tube lies outside the right thoracic cavity posterior to the
bony chest wall with tip within the paraspinal soft tissues at the
level of T5.

Small bilateral pleural effusions as well as mixed interstitial
airspace process over the collapsed right lung as well as the
lingula and left lower lobe with mild nodularity over the left upper
lobe which may be due to an acute infectious or inflammatory
process. Background of mild fibrotic change.

Cirrhosis and moderate ascites.

Remaining tubes and lines as described.

Atherosclerotic coronary artery disease and possible stent involving
the left anterior descending coronary artery.

## 2017-03-24 IMAGING — CR DG CHEST 1V PORT
1 series · 1 of 1 positions shown · non-contrast
Comparison: 11/04/2015.

CLINICAL DATA: Pneumothorax.

EXAM:
PORTABLE CHEST 1 VIEW

[AP]
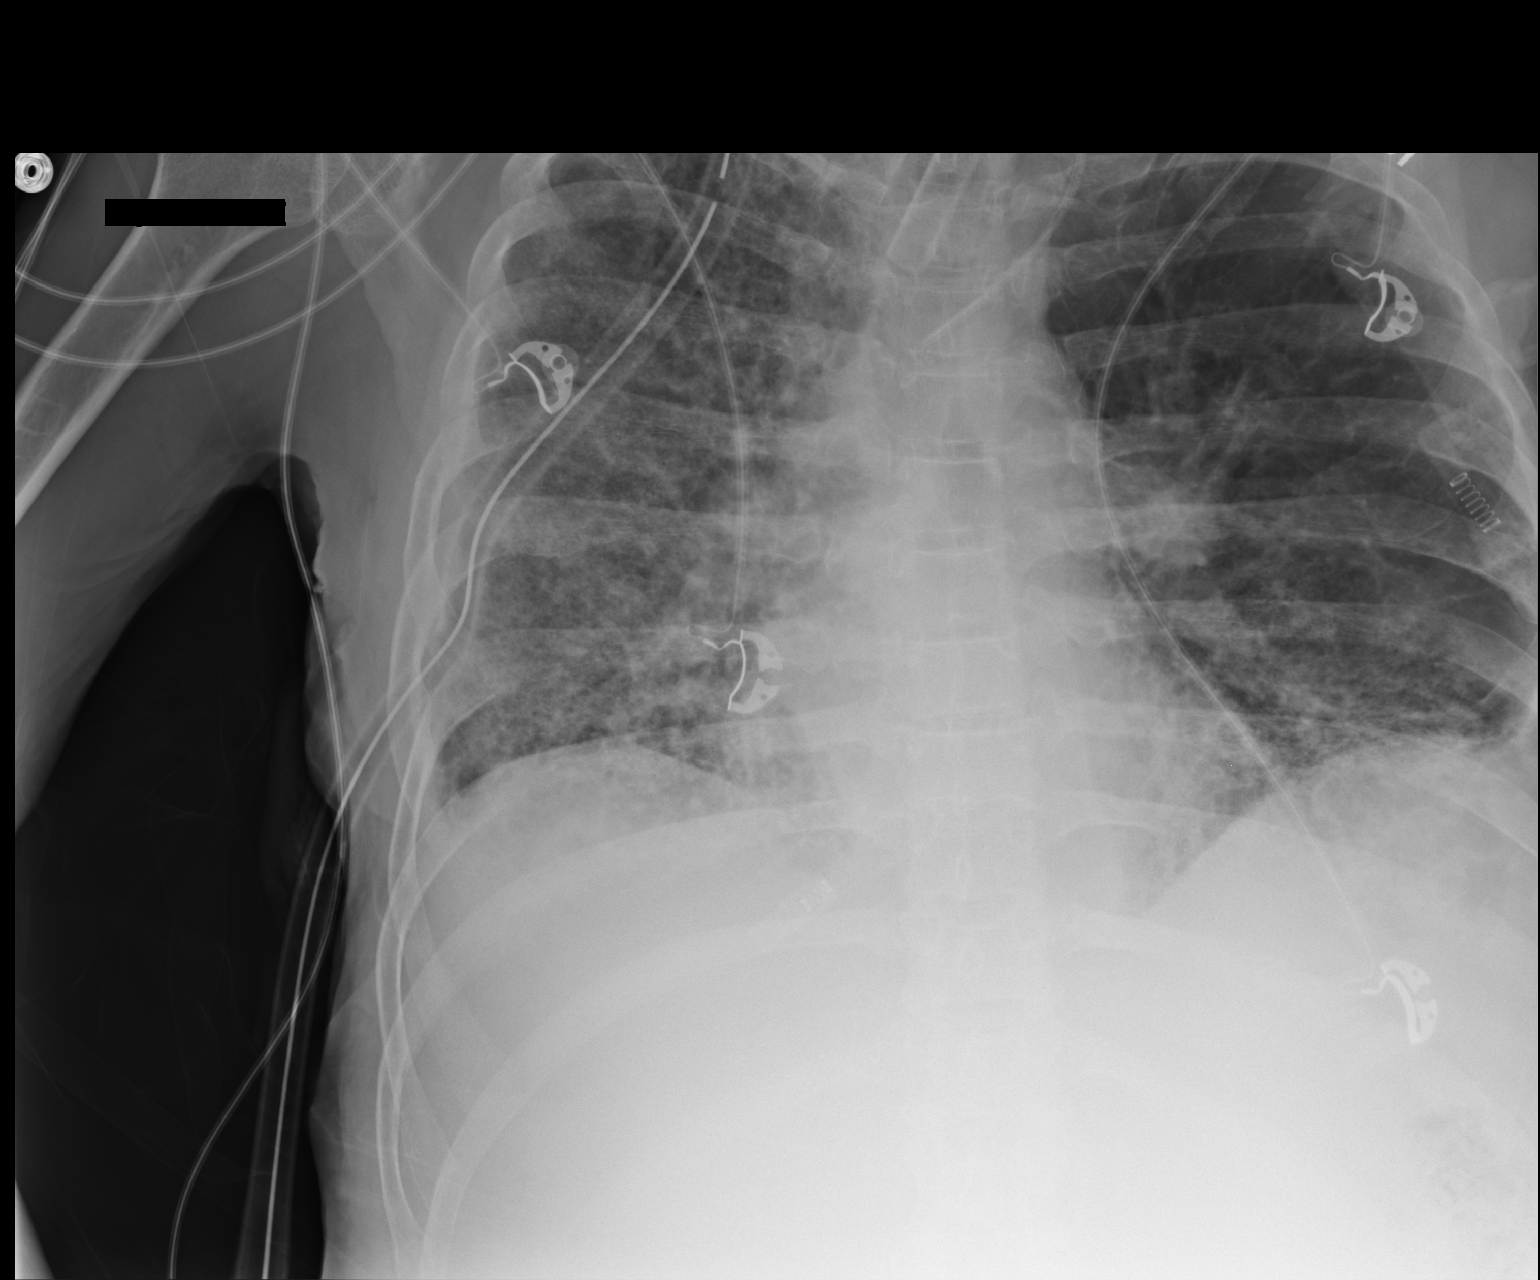

[1 of 1 positions shown; findings below may reference images not displayed]

FINDINGS: Tracheostomy tube, left IJ line in stable position. Right chest tube
in stable position with tip in the right apex. No pneumothorax.
Heart size stable. Stable pulmonary interstitial prominence right
side greater than left. Although component of interstitial
prominence is most likely chronic interstitial lung disease, active
bilateral pulmonary infiltrates cannot be excluded. No pleural
effusions noted on today's exam. Left costophrenic angle is not
imaged.
IMPRESSION: 1. Tracheostomy tube, left IJ line, right chest tube in stable
position. Right chest tube tip is in the right chest apex. No
pneumothorax.
2. Stable bilateral pulmonary interstitial disease, right side
greater than left. Again a component this is most likely chronic
however superimposed active interstitial disease cannot be excluded.
